# Patient Record
Sex: Male | Born: 1980 | Race: Black or African American | Hispanic: No | Marital: Single | Smoking: Current every day smoker
Health system: Southern US, Community
[De-identification: ages and names within clinical notes are randomized; demographics above are authoritative.]

## PROBLEM LIST (undated history)

## (undated) DIAGNOSIS — M5412 Radiculopathy, cervical region: Secondary | ICD-10-CM

---

## 2009-07-27 ENCOUNTER — Emergency Department (HOSPITAL_BASED_OUTPATIENT_CLINIC_OR_DEPARTMENT_OTHER): Admission: EM | Admit: 2009-07-27 | Discharge: 2009-07-27 | Payer: Self-pay | Admitting: Emergency Medicine

## 2009-07-27 ENCOUNTER — Ambulatory Visit: Payer: Self-pay | Admitting: Diagnostic Radiology

## 2009-12-29 ENCOUNTER — Emergency Department (HOSPITAL_BASED_OUTPATIENT_CLINIC_OR_DEPARTMENT_OTHER): Admission: EM | Admit: 2009-12-29 | Discharge: 2009-12-29 | Payer: Self-pay | Admitting: Emergency Medicine

## 2010-04-24 ENCOUNTER — Emergency Department (HOSPITAL_BASED_OUTPATIENT_CLINIC_OR_DEPARTMENT_OTHER): Admission: EM | Admit: 2010-04-24 | Discharge: 2010-04-24 | Payer: Self-pay | Admitting: Emergency Medicine

## 2010-09-08 ENCOUNTER — Emergency Department (HOSPITAL_BASED_OUTPATIENT_CLINIC_OR_DEPARTMENT_OTHER)
Admission: EM | Admit: 2010-09-08 | Discharge: 2010-09-08 | Payer: Self-pay | Source: Home / Self Care | Admitting: Emergency Medicine

## 2010-11-21 ENCOUNTER — Emergency Department (HOSPITAL_BASED_OUTPATIENT_CLINIC_OR_DEPARTMENT_OTHER)
Admission: EM | Admit: 2010-11-21 | Discharge: 2010-11-21 | Disposition: A | Discharge status: Home / Self Care | Payer: Self-pay | Attending: Emergency Medicine | Admitting: Emergency Medicine

## 2010-11-21 DIAGNOSIS — K029 Dental caries, unspecified: Secondary | ICD-10-CM | POA: Insufficient documentation

## 2010-11-21 DIAGNOSIS — F17200 Nicotine dependence, unspecified, uncomplicated: Secondary | ICD-10-CM | POA: Insufficient documentation

## 2012-03-25 ENCOUNTER — Encounter (HOSPITAL_BASED_OUTPATIENT_CLINIC_OR_DEPARTMENT_OTHER): Payer: Self-pay

## 2012-03-25 ENCOUNTER — Emergency Department (HOSPITAL_BASED_OUTPATIENT_CLINIC_OR_DEPARTMENT_OTHER)
Admission: EM | Admit: 2012-03-25 | Discharge: 2012-03-25 | Disposition: A | Discharge status: Home / Self Care | Payer: Self-pay | Attending: Emergency Medicine | Admitting: Emergency Medicine

## 2012-03-25 DIAGNOSIS — Z202 Contact with and (suspected) exposure to infections with a predominantly sexual mode of transmission: Secondary | ICD-10-CM | POA: Insufficient documentation

## 2012-03-25 MED ORDER — AZITHROMYCIN 250 MG PO TABS
1000.0000 mg | ORAL_TABLET | Freq: Once | ORAL | Status: AC
  Administered 2012-03-25: 1000 mg via ORAL
  Filled 2012-03-25: qty 4

## 2012-03-25 MED ORDER — CEFTRIAXONE SODIUM 250 MG IJ SOLR
250.0000 mg | Freq: Once | INTRAMUSCULAR | Status: AC
  Administered 2012-03-25: 250 mg via INTRAMUSCULAR
  Filled 2012-03-25: qty 250

## 2012-03-25 MED ORDER — METRONIDAZOLE 500 MG PO TABS: 500.0000 mg | ORAL_TABLET | Freq: Two times a day (BID) | ORAL | Status: AC

## 2012-03-25 NOTE — ED Notes (Signed)
No adverse reaction noted.

## 2012-03-25 NOTE — ED Provider Notes (Signed)
History     CSN: [id number]  Arrival date & time 03/25/12  1522   First MD Initiated Contact with Patient 03/25/12 1802      Chief Complaint  Patient presents with  . Exposure to STD    (Consider location/radiation/quality/duration/timing/severity/associated sxs/prior treatment) Patient is a 31 y.o. male presenting with STD exposure. The history is provided by the patient. No language interpreter was used.  Exposure to STD This is a new problem. The current episode started in the past 7 days. The problem has been unchanged.  Pt reports he had interrcourse on Sunday and partner called him today and said she had trich and chlamydia.  Pt here requesting treatment.  No symptoms  History reviewed. No pertinent past medical history.  History reviewed. No pertinent past surgical history.  No family history on file.  History  Substance Use Topics  . Smoking status: Current Everyday Smoker  . Smokeless tobacco: Not on file  . Alcohol Use: Yes      Review of Systems  All other systems reviewed and are negative.    Allergies  Review of patient's allergies indicates no known allergies.  Home Medications   Current Outpatient Rx  Name Route Sig Dispense Refill  . PENICILLIN V POTASSIUM 500 MG PO TABS Oral Take 500 mg by mouth 4 (four) times daily. Patient uses this medication for his wisdom tooth.      BP 123/66  Pulse 65  Temp 98.1 F (36.7 C) (Oral)  Resp 16  Ht 5\' 7"  (1.702 m)  Wt 130 lb (58.968 kg)  BMI 20.36 kg/m2  SpO2 100%  Physical Exam  Nursing note and vitals reviewed. Constitutional: He is oriented to person, place, and time. He appears well-developed and well-nourished.  HENT:  Head: Normocephalic.  Eyes: Pupils are equal, round, and reactive to light.  Abdominal: Soft.  Genitourinary: Penis normal.       No discharge  Musculoskeletal: Normal range of motion.  Neurological: He is alert and oriented to person, place, and time. He has normal reflexes.    Skin: Skin is warm.  Psychiatric: He has a normal mood and affect.    ED Course  Procedures (including critical care time)  Labs Reviewed - No data to display No results found.   1. Exposure to STD       MDM  gc and ct pending.  Pt given rocephin, zithromax and rx for flagyl         Elson Areas, PA 03/25/12 [address] [place], [place] 03/25/12 [phone]

## 2012-03-25 NOTE — ED Notes (Signed)
Advised today by sexual partner she is positive for STDs-pt denies c/o

## 2012-03-26 LAB — GC/CHLAMYDIA PROBE AMP, GENITAL: GC Probe Amp, Genital: NEGATIVE

## 2012-03-29 NOTE — ED Provider Notes (Signed)
Medical screening examination/treatment/procedure(s) were performed by non-physician practitioner and as supervising physician I was immediately available for consultation/collaboration.  Rush Salce M Alvenia Treese, MD 03/29/12 1237 

## 2012-09-21 ENCOUNTER — Emergency Department (HOSPITAL_BASED_OUTPATIENT_CLINIC_OR_DEPARTMENT_OTHER): Payer: Self-pay

## 2012-09-21 ENCOUNTER — Emergency Department (HOSPITAL_BASED_OUTPATIENT_CLINIC_OR_DEPARTMENT_OTHER)
Admission: EM | Admit: 2012-09-21 | Discharge: 2012-09-21 | Disposition: A | Discharge status: Home / Self Care | Payer: Self-pay | Attending: Emergency Medicine | Admitting: Emergency Medicine

## 2012-09-21 ENCOUNTER — Encounter (HOSPITAL_BASED_OUTPATIENT_CLINIC_OR_DEPARTMENT_OTHER): Payer: Self-pay | Admitting: *Deleted

## 2012-09-21 DIAGNOSIS — F17200 Nicotine dependence, unspecified, uncomplicated: Secondary | ICD-10-CM | POA: Insufficient documentation

## 2012-09-21 DIAGNOSIS — R197 Diarrhea, unspecified: Secondary | ICD-10-CM | POA: Insufficient documentation

## 2012-09-21 DIAGNOSIS — R509 Fever, unspecified: Secondary | ICD-10-CM | POA: Insufficient documentation

## 2012-09-21 DIAGNOSIS — R51 Headache: Secondary | ICD-10-CM | POA: Insufficient documentation

## 2012-09-21 DIAGNOSIS — R112 Nausea with vomiting, unspecified: Secondary | ICD-10-CM | POA: Insufficient documentation

## 2012-09-21 DIAGNOSIS — J189 Pneumonia, unspecified organism: Secondary | ICD-10-CM | POA: Insufficient documentation

## 2012-09-21 LAB — CBC
HCT: 45 % (ref 39.0–52.0)
Hemoglobin: 16.6 g/dL (ref 13.0–17.0)
MCH: 28.5 pg (ref 26.0–34.0)
MCHC: 36.9 g/dL — ABNORMAL HIGH (ref 30.0–36.0)
MCV: 77.2 fL — ABNORMAL LOW (ref 78.0–100.0)
RDW: 12.3 % (ref 11.5–15.5)

## 2012-09-21 LAB — BASIC METABOLIC PANEL
BUN: 12 mg/dL (ref 6–23)
CO2: 23 mEq/L (ref 19–32)
Chloride: 98 mEq/L (ref 96–112)
Creatinine, Ser: 0.9 mg/dL (ref 0.50–1.35)
GFR calc Af Amer: >90 mL/min (ref >90)
Glucose, Bld: 136 mg/dL — ABNORMAL HIGH (ref 70–99)
Potassium: 3.6 mEq/L (ref 3.5–5.1)

## 2012-09-21 MED ORDER — ONDANSETRON HCL 4 MG/2ML IJ SOLN
4.0000 mg | Freq: Once | INTRAMUSCULAR | Status: AC
  Administered 2012-09-21: 4 mg via INTRAVENOUS
  Filled 2012-09-21: qty 2

## 2012-09-21 MED ORDER — LEVOFLOXACIN 500 MG PO TABS
500.0000 mg | ORAL_TABLET | Freq: Once | ORAL | Status: AC
  Administered 2012-09-21: 500 mg via ORAL
  Filled 2012-09-21: qty 1

## 2012-09-21 MED ORDER — DIPHENOXYLATE-ATROPINE 2.5-0.025 MG PO TABS
1.0000 | ORAL_TABLET | Freq: Once | ORAL | Status: AC
  Administered 2012-09-21: 1 via ORAL
  Filled 2012-09-21: qty 1

## 2012-09-21 MED ORDER — LEVOFLOXACIN 500 MG PO TABS: 500.0000 mg | ORAL_TABLET | Freq: Every day | ORAL | Status: DC

## 2012-09-21 MED ORDER — SODIUM CHLORIDE 0.9 % IV BOLUS (SEPSIS)
1000.0000 mL | Freq: Once | INTRAVENOUS | Status: AC
  Administered 2012-09-21: 1000 mL via INTRAVENOUS

## 2012-09-21 NOTE — ED Provider Notes (Signed)
History     CSN: [id number]  Arrival date & time 09/21/12  1603   First MD Initiated Contact with Patient 09/21/12 1624      Chief Complaint  Patient presents with  . Cough    (Consider location/radiation/quality/duration/timing/severity/associated sxs/prior treatment) Patient is a 31 y.o. male presenting with cough. The history is provided by the patient. No language interpreter was used.  Cough This is a new problem. The current episode started more than 2 days ago. The problem has been gradually worsening. The cough is productive of sputum. The maximum temperature recorded prior to his arrival was 103 to 104 F. Associated symptoms include chills, sweats and headaches. Pertinent negatives include no chest pain and no shortness of breath. He is a smoker.   31 year old male coming in with complaint of productive cough for 4 days with fever vomiting and diarrhea. States he is also had body aches and headaches and decreased appetite. He has been on  no antibiotics in the last 3 months. He has had sick contacts. His stepson is also sick. Patient has taken over-the-counter Tylenol with some relief. Denies chest pain or shortness of breath. He took TheraFlu as well and felt better but got worse after 2 days.  History reviewed. No pertinent past medical history.  History reviewed. No pertinent past surgical history.  History reviewed. No pertinent family history.  History  Substance Use Topics  . Smoking status: Current Every Day Smoker  . Smokeless tobacco: Not on file  . Alcohol Use: Yes      Review of Systems  Constitutional: Positive for fever and chills.  Eyes: Negative.   Respiratory: Positive for cough. Negative for shortness of breath.   Cardiovascular: Negative.  Negative for chest pain.  Gastrointestinal: Positive for nausea, vomiting and diarrhea. Negative for abdominal pain.  Musculoskeletal: Negative for back pain.  Neurological: Positive for headaches.    Psychiatric/Behavioral: Negative.   All other systems reviewed and are negative.    Allergies  Review of patient's allergies indicates no known allergies.  Home Medications   Current Outpatient Rx  Name  Route  Sig  Dispense  Refill  . PENICILLIN V POTASSIUM 500 MG PO TABS   Oral   Take 500 mg by mouth 4 (four) times daily. Patient uses this medication for his wisdom tooth.           BP 105/90  Pulse 71  Temp 98.3 F (36.8 C)  Resp 18  Ht 5\' 9"  (1.753 m)  Wt 135 lb (61.236 kg)  BMI 19.94 kg/m2  SpO2 98%  Physical Exam  Nursing note and vitals reviewed. Constitutional: He is oriented to person, place, and time. He appears well-developed and well-nourished.  HENT:  Head: Normocephalic.  Eyes: Conjunctivae normal and EOM are normal. Pupils are equal, round, and reactive to light.  Neck: Normal range of motion. Neck supple.  Cardiovascular: Normal rate.   Pulmonary/Chest: Effort normal. No respiratory distress. He has no wheezes.       Decreased breath sounds on the right  Abdominal: Soft. Bowel sounds are normal. He exhibits no distension.  Musculoskeletal: Normal range of motion.  Neurological: He is alert and oriented to person, place, and time.  Skin: Skin is warm and dry.  Psychiatric: He has a normal mood and affect.    ED Course  Procedures (including critical care time)   Labs Reviewed  BASIC METABOLIC PANEL  CBC   No results found.   No diagnosis found.  MDM  Chest x-ray shows pneumonia in the right middle lobe. Cough nausea vomiting and diarrhea for 3 days with a fever. Prescription for Levaquin. Patient understands to come back for worsening symptoms. White blood cell count today was normal. He was given Lomotil Zofran and IV fluid in the ER.         Remi Haggard, NP 09/21/12 1807

## 2012-09-21 NOTE — ED Notes (Signed)
Pharmacists called to say Levaquin was too expensive for the pt.  Per MD [facility], it is ok to change rx to a Z-pack.  Pharmacy aware.

## 2012-09-21 NOTE — ED Notes (Signed)
Got sick on Christmas with flu-like s/s. Took Theraflu and felt better. Went outside yesterday and s/s returned, but worse. Now has N/V/D

## 2012-09-23 NOTE — ED Provider Notes (Signed)
Medical screening examination/treatment/procedure(s) were performed by non-physician practitioner and as supervising physician I was immediately available for consultation/collaboration.  Augusta Mirkin, MD 09/23/12 0952 

## 2013-06-15 ENCOUNTER — Encounter (HOSPITAL_BASED_OUTPATIENT_CLINIC_OR_DEPARTMENT_OTHER): Payer: Self-pay

## 2013-06-15 ENCOUNTER — Emergency Department (HOSPITAL_BASED_OUTPATIENT_CLINIC_OR_DEPARTMENT_OTHER)
Admission: EM | Admit: 2013-06-15 | Discharge: 2013-06-15 | Disposition: A | Discharge status: Home / Self Care | Payer: Self-pay | Attending: Emergency Medicine | Admitting: Emergency Medicine

## 2013-06-15 DIAGNOSIS — Z792 Long term (current) use of antibiotics: Secondary | ICD-10-CM | POA: Insufficient documentation

## 2013-06-15 DIAGNOSIS — R369 Urethral discharge, unspecified: Secondary | ICD-10-CM | POA: Insufficient documentation

## 2013-06-15 DIAGNOSIS — F17200 Nicotine dependence, unspecified, uncomplicated: Secondary | ICD-10-CM | POA: Insufficient documentation

## 2013-06-15 LAB — URINALYSIS, ROUTINE W REFLEX MICROSCOPIC
Bilirubin Urine: NEGATIVE
Glucose, UA: NEGATIVE mg/dL
Ketones, ur: 15 mg/dL — AB
Nitrite: NEGATIVE
Specific Gravity, Urine: 1.017 (ref 1.005–1.030)
pH: 6 (ref 5.0–8.0)

## 2013-06-15 LAB — URINE MICROSCOPIC-ADD ON

## 2013-06-15 MED ORDER — CEFTRIAXONE SODIUM 250 MG IJ SOLR
250.0000 mg | Freq: Once | INTRAMUSCULAR | Status: AC
  Administered 2013-06-15: 250 mg via INTRAMUSCULAR
  Filled 2013-06-15: qty 250

## 2013-06-15 MED ORDER — AZITHROMYCIN 250 MG PO TABS
1000.0000 mg | ORAL_TABLET | Freq: Once | ORAL | Status: AC
  Administered 2013-06-15: 1000 mg via ORAL
  Filled 2013-06-15: qty 4

## 2013-06-15 NOTE — ED Notes (Signed)
C/o penile d/c 2 days ago while in the shower-none since then-denies dysuria-admits to unprotected intercourse

## 2013-06-15 NOTE — ED Provider Notes (Signed)
CSN: [id number]     Arrival date & time 06/15/13  1328 History   First MD Initiated Contact with Patient 06/15/13 1341     Chief Complaint  Patient presents with  . Penile Discharge   (Consider location/radiation/quality/duration/timing/severity/associated sxs/prior Treatment) HPI Comments: Pt states that he noticed a white discharge 2 days ago and hasn't noticed any since:pt states that he has a similar history with std in the past:denies dysuria, fever, abdominal pain or rash  The history is provided by the patient. No language interpreter was used.    History reviewed. No pertinent past medical history. History reviewed. No pertinent past surgical history. No family history on file. History  Substance Use Topics  . Smoking status: Current Every Day Smoker  . Smokeless tobacco: Not on file  . Alcohol Use: Yes    Review of Systems  Constitutional: Negative.   Respiratory: Negative.   Cardiovascular: Negative.     Allergies  Review of patient's allergies indicates no known allergies.  Home Medications   Current Outpatient Rx  Name  Route  Sig  Dispense  Refill  . levofloxacin (LEVAQUIN) 500 MG tablet   Oral   Take 1 tablet (500 mg total) by mouth daily.   7 tablet   0   . penicillin v potassium (VEETID) 500 MG tablet   Oral   Take 500 mg by mouth 4 (four) times daily. Patient uses this medication for his wisdom tooth.          BP 129/66  Pulse 81  Temp(Src) 98.4 F (36.9 C) (Oral)  Resp 16  Ht 5\' 7"  (1.702 m)  Wt 130 lb (58.968 kg)  BMI 20.36 kg/m2  SpO2 100% Physical Exam  Nursing note and vitals reviewed. Constitutional: He is oriented to person, place, and time. He appears well-developed and well-nourished.  Cardiovascular: Normal rate and regular rhythm.   Pulmonary/Chest: Effort normal and breath sounds normal.  Abdominal: Soft. Bowel sounds are normal.  Genitourinary: No penile tenderness.  Musculoskeletal: Normal range of motion.  Neurological:  He is alert and oriented to person, place, and time.  Skin: Skin is warm and dry.    ED Course  Procedures (including critical care time) Labs Review Labs Reviewed  URINALYSIS, ROUTINE W REFLEX MICROSCOPIC - Abnormal; Notable for the following:    Hgb urine dipstick SMALL (*)    Ketones, ur 15 (*)    All other components within normal limits  GC/CHLAMYDIA PROBE AMP  URINE MICROSCOPIC-ADD ON   Imaging Review No results found.  MDM   1. Penile discharge    Pt treated based on history;culture sent:pt treated:also instructed on safe sex practices    Teressa Lower, NP 06/15/13 1428

## 2013-06-15 NOTE — ED Provider Notes (Signed)
Medical screening examination/treatment/procedure(s) were performed by non-physician practitioner and as supervising physician I was immediately available for consultation/collaboration.  Javoni Lucken, MD 06/15/13 1458 

## 2013-07-01 ENCOUNTER — Emergency Department (HOSPITAL_BASED_OUTPATIENT_CLINIC_OR_DEPARTMENT_OTHER)
Admission: EM | Admit: 2013-07-01 | Discharge: 2013-07-01 | Disposition: A | Discharge status: Home / Self Care | Payer: Self-pay | Attending: Emergency Medicine | Admitting: Emergency Medicine

## 2013-07-01 ENCOUNTER — Encounter (HOSPITAL_BASED_OUTPATIENT_CLINIC_OR_DEPARTMENT_OTHER): Payer: Self-pay | Admitting: Emergency Medicine

## 2013-07-01 DIAGNOSIS — H109 Unspecified conjunctivitis: Secondary | ICD-10-CM | POA: Insufficient documentation

## 2013-07-01 DIAGNOSIS — F17200 Nicotine dependence, unspecified, uncomplicated: Secondary | ICD-10-CM | POA: Insufficient documentation

## 2013-07-01 MED ORDER — SULFACETAMIDE SODIUM 10 % OP SOLN: 1.0000 [drp] | OPHTHALMIC | Status: DC

## 2013-07-01 NOTE — ED Notes (Signed)
3 days of eyes being red painful swollen and watering

## 2013-07-01 NOTE — ED Provider Notes (Signed)
CSN: [id number]     Arrival date & time 07/01/13  1128 History   First MD Initiated Contact with Patient 07/01/13 1416     Chief Complaint  Patient presents with  . Eye Pain   (Consider location/radiation/quality/duration/timing/severity/associated sxs/prior Treatment) HPI Comments: Patient is a 32 year old otherwise healthy male presents with complaints of bilateral eye redness and discomfort in the past 3 days. He denies any injury or trauma and denies any fluid or chemical exposure. He denies any recent ill contacts and he is not a contact lens wearer.  Patient is a 32 y.o. male presenting with eye pain. The history is provided by the patient.  Eye Pain This is a new problem. Episode onset: 3 days ago. The problem occurs constantly. The problem has been gradually worsening. Nothing aggravates the symptoms. Nothing relieves the symptoms. He has tried nothing for the symptoms. The treatment provided no relief.    History reviewed. No pertinent past medical history. History reviewed. No pertinent past surgical history. History reviewed. No pertinent family history. History  Substance Use Topics  . Smoking status: Current Every Day Smoker  . Smokeless tobacco: Not on file  . Alcohol Use: Yes    Review of Systems  Eyes: Positive for pain.  All other systems reviewed and are negative.    Allergies  Review of patient's allergies indicates no known allergies.  Home Medications   Current Outpatient Rx  Name  Route  Sig  Dispense  Refill  . levofloxacin (LEVAQUIN) 500 MG tablet   Oral   Take 1 tablet (500 mg total) by mouth daily.   7 tablet   0   . penicillin v potassium (VEETID) 500 MG tablet   Oral   Take 500 mg by mouth 4 (four) times daily. Patient uses this medication for his wisdom tooth.         . sulfacetamide (BLEPH-10) 10 % ophthalmic solution   Both Eyes   Place 1-2 drops into both eyes every 4 (four) hours.   5 mL   0    BP 105/64  Pulse 58  Temp(Src)  98.6 F (37 C) (Oral)  Resp 16  SpO2 100% Physical Exam  Nursing note and vitals reviewed. Constitutional: He is oriented to person, place, and time. He appears well-developed and well-nourished.  HENT:  Head: Normocephalic and atraumatic.  Mouth/Throat: Oropharynx is clear and moist.  Eyes:  Bilateral conjunctiva are injected. The corneas are clear and the pupils are reactive. There is no foreign body noted under the eyelids.  Neck: Normal range of motion. Neck supple.  Musculoskeletal: Normal range of motion.  Neurological: He is alert and oriented to person, place, and time.  Skin: Skin is warm. He is not diaphoretic.    ED Course  Procedures (including critical care time) Labs Review Labs Reviewed - No data to display Imaging Review No results found.  MDM   1. Conjunctivitis    This appears to be conjunctivitis, possibly bacterial. As he continues to worsen and is now having drainage from the eyes I will treat with Bleph-10. He is to return as needed if not improving or if his symptoms worsen.    Geoffery Lyons, MD 07/01/13 [id number]

## 2013-07-01 NOTE — ED Notes (Signed)
Pt c/o eyes watering and red x 3 days  Denies inj or fb,  Has used otc meds but no improvement

## 2013-12-21 ENCOUNTER — Encounter (HOSPITAL_BASED_OUTPATIENT_CLINIC_OR_DEPARTMENT_OTHER): Payer: Self-pay | Admitting: Emergency Medicine

## 2013-12-21 ENCOUNTER — Emergency Department (HOSPITAL_BASED_OUTPATIENT_CLINIC_OR_DEPARTMENT_OTHER)
Admission: EM | Admit: 2013-12-21 | Discharge: 2013-12-21 | Disposition: A | Discharge status: Home / Self Care | Payer: Self-pay | Attending: Emergency Medicine | Admitting: Emergency Medicine

## 2013-12-21 DIAGNOSIS — Z202 Contact with and (suspected) exposure to infections with a predominantly sexual mode of transmission: Secondary | ICD-10-CM | POA: Insufficient documentation

## 2013-12-21 DIAGNOSIS — Z792 Long term (current) use of antibiotics: Secondary | ICD-10-CM | POA: Insufficient documentation

## 2013-12-21 DIAGNOSIS — F17200 Nicotine dependence, unspecified, uncomplicated: Secondary | ICD-10-CM | POA: Insufficient documentation

## 2013-12-21 LAB — URINALYSIS, ROUTINE W REFLEX MICROSCOPIC
Bilirubin Urine: NEGATIVE
GLUCOSE, UA: NEGATIVE mg/dL
Hgb urine dipstick: NEGATIVE
KETONES UR: NEGATIVE mg/dL
LEUKOCYTES UA: NEGATIVE
Nitrite: NEGATIVE
PH: 6 (ref 5.0–8.0)
Protein, ur: NEGATIVE mg/dL
Specific Gravity, Urine: 1.021 (ref 1.005–1.030)
Urobilinogen, UA: 0.2 mg/dL (ref 0.0–1.0)

## 2013-12-21 MED ORDER — CEFTRIAXONE SODIUM 250 MG IJ SOLR
250.0000 mg | Freq: Once | INTRAMUSCULAR | Status: AC
  Administered 2013-12-21: 250 mg via INTRAMUSCULAR
  Filled 2013-12-21: qty 250

## 2013-12-21 MED ORDER — METRONIDAZOLE 500 MG PO TABS: 500.0000 mg | ORAL_TABLET | Freq: Two times a day (BID) | ORAL | Status: DC

## 2013-12-21 MED ORDER — AZITHROMYCIN 250 MG PO TABS
1000.0000 mg | ORAL_TABLET | Freq: Once | ORAL | Status: AC
  Administered 2013-12-21: 1000 mg via ORAL
  Filled 2013-12-21: qty 4

## 2013-12-21 NOTE — ED Notes (Signed)
States he has an STD and needs to be treated.

## 2013-12-21 NOTE — ED Provider Notes (Signed)
CSN: [id number]     Arrival date & time 12/21/13  1824 History   First MD Initiated Contact with Patient 12/21/13 2017     No chief complaint on file.    (Consider location/radiation/quality/duration/timing/severity/associated sxs/prior Treatment) HPI Comments: Patient is a 33 year old male who presents to the emergency department requesting treatment for sexually transmitted diseases. States his girlfriend was diagnosed with trichomonas 2 days ago. Denies penile discharge, pain, testicular pain or swelling, abdominal pain, nausea or vomiting.  The history is provided by the patient.    History reviewed. No pertinent past medical history. History reviewed. No pertinent past surgical history. No family history on file. History  Substance Use Topics  . Smoking status: Current Every Day Smoker  . Smokeless tobacco: Not on file  . Alcohol Use: Yes    Review of Systems  Constitutional: Negative for fever.  Gastrointestinal: Negative for nausea and abdominal pain.  Genitourinary: Negative for frequency, penile swelling, scrotal swelling, difficulty urinating, genital sores, penile pain and testicular pain.  Musculoskeletal: Negative for back pain.  Skin: Negative for rash.      Allergies  Review of patient's allergies indicates no known allergies.  Home Medications   Current Outpatient Rx  Name  Route  Sig  Dispense  Refill  . levofloxacin (LEVAQUIN) 500 MG tablet   Oral   Take 1 tablet (500 mg total) by mouth daily.   7 tablet   0   . metroNIDAZOLE (FLAGYL) 500 MG tablet   Oral   Take 1 tablet (500 mg total) by mouth 2 (two) times daily. One po bid x 7 days   14 tablet   0   . penicillin v potassium (VEETID) 500 MG tablet   Oral   Take 500 mg by mouth 4 (four) times daily. Patient uses this medication for his wisdom tooth.         . sulfacetamide (BLEPH-10) 10 % ophthalmic solution   Both Eyes   Place 1-2 drops into both eyes every 4 (four) hours.   5 mL   0    BP 121/60  Pulse 61  Temp(Src) 98.5 F (36.9 C) (Oral)  Resp 16  Ht 5\' 8"  (1.727 m)  Wt 130 lb (58.968 kg)  BMI 19.77 kg/m2  SpO2 98% Physical Exam  Nursing note and vitals reviewed. Constitutional: He is oriented to person, place, and time. He appears well-developed and well-nourished. No distress.  HENT:  Head: Normocephalic and atraumatic.  Mouth/Throat: Oropharynx is clear and moist.  Eyes: Conjunctivae are normal.  Neck: Normal range of motion. Neck supple.  Cardiovascular: Normal rate, regular rhythm and normal heart sounds.   Pulmonary/Chest: Effort normal and breath sounds normal.  Abdominal: Soft. Bowel sounds are normal. There is no tenderness.  Genitourinary: Right testis shows no mass, no swelling and no tenderness. Left testis shows no mass, no swelling and no tenderness. Uncircumcised. No penile erythema or penile tenderness.  Exam chaperoned.  Musculoskeletal: Normal range of motion. He exhibits no edema.  Lymphadenopathy:       Right: No inguinal adenopathy present.       Left: No inguinal adenopathy present.  Neurological: He is alert and oriented to person, place, and time.  Skin: Skin is warm and dry. He is not diaphoretic.  Psychiatric: He has a normal mood and affect. His behavior is normal.    ED Course  Procedures (including critical care time) Labs Review Labs Reviewed  GC/CHLAMYDIA PROBE AMP  URINALYSIS, ROUTINE W REFLEX MICROSCOPIC  Imaging Review No results found.   EKG Interpretation None      MDM   Final diagnoses:  Exposure to STD   Cultures obtained. Patient given 250 mg IM Rocephin and 1 g PO azithromycin. Prescription for Flagyl at discharge. Safe sex practices discussed. Stable for discharge.  Trevor Mace, PA-C 12/21/13 2029

## 2013-12-21 NOTE — Discharge Instructions (Signed)
You were treated today for gonorrhea, Chlamydia and Trichomonas. It is important for you to take Flagyl for the next 7 days, do not drink anything with alcohol products while taking Flagyl. If your gonorrhea and Chlamydia cultures results positive, you will be informed and are obligated to inform your partner.  Sexually Transmitted Disease A sexually transmitted disease (STD) is a disease or infection that may be passed (transmitted) from person to person, usually during sexual activity. This may happen by way of saliva, semen, blood, vaginal mucus, or urine. Common STDs include:   Gonorrhea.   Chlamydia.   Syphilis.   HIV and AIDS.   Genital herpes.   Hepatitis B and C.   Trichomonas.   Human papillomavirus (HPV).   Pubic lice.   Scabies.  Mites.  Bacterial vaginosis. WHAT ARE CAUSES OF STDs? An STD may be caused by bacteria, a virus, or parasites. STDs are often transmitted during sexual activity if one person is infected. However, they may also be transmitted through nonsexual means. STDs may be transmitted after:   Sexual intercourse with an infected person.   Sharing sex toys with an infected person.   Sharing needles with an infected person or using unclean piercing or tattoo needles.  Having intimate contact with the genitals, mouth, or rectal areas of an infected person.   Exposure to infected fluids during birth. WHAT ARE THE SIGNS AND SYMPTOMS OF STDs? Different STDs have different symptoms. Some people may not have any symptoms. If symptoms are present, they may include:   Painful or bloody urination.   Pain in the pelvis, abdomen, vagina, anus, throat, or eyes.   Skin rash, itching, irritation, growths, sores (lesions), ulcerations, or warts in the genital or anal area.  Abnormal vaginal discharge with or without bad odor.   Penile discharge in men.   Fever.   Pain or bleeding during sexual intercourse.   Swollen glands in the  groin area.   Yellow skin and eyes (jaundice). This is seen with hepatitis.   Swollen testicles.  Infertility.  Sores and blisters in the mouth. HOW ARE STDs DIAGNOSED? To make a diagnosis, your health care provider may:   Take a medical history.   Perform a physical exam.   Take a sample of any discharge for examination.  Swab the throat, cervix, opening to the penis, rectum, or vagina for testing.  Test a sample of your first morning urine.   Perform blood tests.   Perform a Pap smear, if this applies.   Perform a colposcopy.   Perform a laparoscopy.  HOW ARE STDs TREATED? Treatment depends on the STD. Some STDs may be treated but not cured.   Chlamydia, gonorrhea, trichomonas, and syphilis can be cured with antibiotics.   Genital herpes, hepatitis, and HIV can be treated, but not cured, with prescribed medicines. The medicines lessen symptoms.   Genital warts from HPV can be treated with medicine or by freezing, burning (electrocautery), or surgery. Warts may come back.   HPV cannot be cured with medicine or surgery. However, abnormal areas may be removed from the cervix, vagina, or vulva.   If your diagnosis is confirmed, your recent sexual partners need treatment. This is true even if they are symptom-free or have a negative culture or evaluation. They should not have sex until their health care providers say it is OK. HOW CAN I REDUCE MY RISK OF GETTING AN STD?  Use latex condoms, dental dams, and water-soluble lubricants during sexual activity. Do not  use petroleum jelly or oils.  Get vaccinated for HPV and hepatitis. If you have not received these vaccines in the past, talk to your health care provider about whether one or both might be right for you.   Avoid risky sex practices that can break the skin.  WHAT SHOULD I DO IF I THINK I HAVE AN STD?  See your health care provider.   Inform all sexual partners. They should be tested and treated  for any STDs.  Do not have sex until your health care provider says it is OK. WHEN SHOULD I GET HELP? Seek immediate medical care if:  You develop severe abdominal pain.  You are a man and notice swelling or pain in the testicles.  You are a woman and notice swelling or pain in your vagina. Document Released: 12/01/2002 Document Revised: 07/01/2013 Document Reviewed: 03/31/2013 [facility] Patient Information 2014 [place], [place].

## 2013-12-22 LAB — GC/CHLAMYDIA PROBE AMP
CT Probe RNA: POSITIVE — AB
GC Probe RNA: NEGATIVE

## 2013-12-22 NOTE — ED Provider Notes (Signed)
Medical screening examination/treatment/procedure(s) were performed by non-physician practitioner and as supervising physician I was immediately available for consultation/collaboration.   EKG Interpretation None        Derek Huneycutt, MD 12/22/13 0011 

## 2013-12-23 NOTE — ED Notes (Signed)
+   Chlamydia Patient treated with Rocephin and Zithromax-[facility]

## 2013-12-25 ENCOUNTER — Telehealth (HOSPITAL_BASED_OUTPATIENT_CLINIC_OR_DEPARTMENT_OTHER): Payer: Self-pay

## 2013-12-25 NOTE — Telephone Encounter (Signed)
Pt calling for STD results.  ID verified x 2.  Pt informed Gonorrhea was negative and Chlamydia was positive.  Pt txd appropriately w/Zithromax and Rocephin.  Pt asked to inform partner for testing and tx and abstain from sex for 2 wks post tx.

## 2014-08-13 ENCOUNTER — Emergency Department (HOSPITAL_BASED_OUTPATIENT_CLINIC_OR_DEPARTMENT_OTHER)
Admission: EM | Admit: 2014-08-13 | Discharge: 2014-08-13 | Disposition: A | Discharge status: Home / Self Care | Payer: Self-pay | Attending: Emergency Medicine | Admitting: Emergency Medicine

## 2014-08-13 ENCOUNTER — Emergency Department (HOSPITAL_BASED_OUTPATIENT_CLINIC_OR_DEPARTMENT_OTHER): Payer: Self-pay

## 2014-08-13 DIAGNOSIS — Z792 Long term (current) use of antibiotics: Secondary | ICD-10-CM | POA: Insufficient documentation

## 2014-08-13 DIAGNOSIS — R05 Cough: Secondary | ICD-10-CM

## 2014-08-13 DIAGNOSIS — R059 Cough, unspecified: Secondary | ICD-10-CM

## 2014-08-13 DIAGNOSIS — J40 Bronchitis, not specified as acute or chronic: Secondary | ICD-10-CM

## 2014-08-13 DIAGNOSIS — J209 Acute bronchitis, unspecified: Secondary | ICD-10-CM | POA: Insufficient documentation

## 2014-08-13 DIAGNOSIS — Z79899 Other long term (current) drug therapy: Secondary | ICD-10-CM | POA: Insufficient documentation

## 2014-08-13 DIAGNOSIS — Z720 Tobacco use: Secondary | ICD-10-CM | POA: Insufficient documentation

## 2014-08-13 MED ORDER — BENZONATATE 100 MG PO CAPS: 100.0000 mg | ORAL_CAPSULE | Freq: Three times a day (TID) | ORAL | Status: DC

## 2014-08-13 MED ORDER — ALBUTEROL SULFATE HFA 108 (90 BASE) MCG/ACT IN AERS
2.0000 | INHALATION_SPRAY | RESPIRATORY_TRACT | Status: DC | PRN
  Administered 2014-08-13: 2 via RESPIRATORY_TRACT
  Filled 2014-08-13 (×2): qty 6.7

## 2014-08-13 MED ORDER — AZITHROMYCIN 250 MG PO TABS: 250.0000 mg | ORAL_TABLET | Freq: Every day | ORAL | Status: DC

## 2014-08-13 NOTE — ED Provider Notes (Signed)
CSN: [id number]     Arrival date & time 08/13/14  1415 History   First MD Initiated Contact with Patient 08/13/14 1541     Chief Complaint  Patient presents with  . Cough     (Consider location/radiation/quality/duration/timing/severity/associated sxs/prior Treatment) HPI Comments: Patient presents with worsening cough over last 2 weeks. He states it started as some Raynaud's and nasal congestion over the last week and half that has progressed to chest congestion. He is coughing up dark green sputum. He denies any shortness of breath. Denies any chest pain. There is no fevers nausea or vomiting. He is a current everyday smoker. He denies any leg pain or swelling. He's been taking over-the-counter medicines without relief.  Patient is a 33 y.o. male presenting with cough.  Cough Associated symptoms: rhinorrhea and wheezing   Associated symptoms: no chest pain, no chills, no diaphoresis, no fever, no headaches, no rash and no shortness of breath     No past medical history on file. No past surgical history on file. No family history on file. History  Substance Use Topics  . Smoking status: Current Every Day Smoker  . Smokeless tobacco: Not on file  . Alcohol Use: Yes    Review of Systems  Constitutional: Negative for fever, chills, diaphoresis and fatigue.  HENT: Positive for congestion and rhinorrhea. Negative for sneezing.   Eyes: Negative.   Respiratory: Positive for cough and wheezing. Negative for chest tightness and shortness of breath.   Cardiovascular: Negative for chest pain and leg swelling.  Gastrointestinal: Negative for nausea, vomiting, abdominal pain, diarrhea and blood in stool.  Genitourinary: Negative for frequency, hematuria, flank pain and difficulty urinating.  Musculoskeletal: Negative for back pain and arthralgias.  Skin: Negative for rash.  Neurological: Negative for dizziness, speech difficulty, weakness, numbness and headaches.      Allergies   Review of patient's allergies indicates no known allergies.  Home Medications   Prior to Admission medications   Medication Sig Start Date End Date Taking? Authorizing Provider  azithromycin (ZITHROMAX) 250 MG tablet Take 1 tablet (250 mg total) by mouth daily. Take first 2 tablets together, then 1 every day until finished. 08/13/14   Rolan BuccoMelanie Sindy Mccune, MD  benzonatate (TESSALON) 100 MG capsule Take 1 capsule (100 mg total) by mouth every 8 (eight) hours. 08/13/14   Rolan BuccoMelanie Leshawn Straka, MD  metroNIDAZOLE (FLAGYL) 500 MG tablet Take 1 tablet (500 mg total) by mouth 2 (two) times daily. One po bid x 7 days 12/21/13   Kathrynn Speedobyn M Hess, PA-C  penicillin v potassium (VEETID) 500 MG tablet Take 500 mg by mouth 4 (four) times daily. Patient uses this medication for his wisdom tooth.    Historical Provider, MD  sulfacetamide (BLEPH-10) 10 % ophthalmic solution Place 1-2 drops into both eyes every 4 (four) hours. 07/01/13   Geoffery Lyonsouglas Delo, MD   BP 112/49 mmHg  Pulse 67  Temp(Src) 99.5 F (37.5 C)  Resp 17  Ht 5\' 9"  (1.753 m)  Wt 135 lb (61.236 kg)  BMI 19.93 kg/m2  SpO2 98% Physical Exam  Constitutional: He is oriented to person, place, and time. He appears well-developed and well-nourished.  HENT:  Head: Normocephalic and atraumatic.  Eyes: Pupils are equal, round, and reactive to light.  Neck: Normal range of motion. Neck supple.  Cardiovascular: Normal rate, regular rhythm and normal heart sounds.   Pulmonary/Chest: Effort normal. No respiratory distress. He has wheezes (mild expiratory wheezes at the bases bilaterally, no increased work of breathing). He has  no rales. He exhibits no tenderness.  Abdominal: Soft. Bowel sounds are normal. There is no tenderness. There is no rebound and no guarding.  Musculoskeletal: Normal range of motion. He exhibits no edema.  Lymphadenopathy:    He has no cervical adenopathy.  Neurological: He is alert and oriented to person, place, and time.  Skin: Skin is warm and  dry. No rash noted.  Psychiatric: He has a normal mood and affect.    ED Course  Procedures (including critical care time) Labs Review Labs Reviewed - No data to display  Imaging Review Dg Chest 2 View  08/13/2014   CLINICAL DATA:  Initial encounter for two week history of cough.  EXAM: CHEST  2 VIEW  COMPARISON:  09/21/2012  FINDINGS: The heart size and mediastinal contours are within normal limits. Both lungs are clear. The visualized skeletal structures are unremarkable.  IMPRESSION: Normal exam.   Electronically Signed   By: Kennith CenterEric  Mansell M.D.   On: 08/13/2014 15:05     EKG Interpretation None      MDM   Final diagnoses:  Bronchitis    No evidence of pneumonia.  Since has been going on for 2 weeks and +smoking, will start on abx, albuterol inhaler, tessalon perles.  Advised to return if his symptoms worsen.    Rolan BuccoMelanie Sheri Prows, MD 08/13/14 [phone]

## 2014-08-13 NOTE — ED Notes (Signed)
Cough with congestion coughing up dk green phlem per pt.

## 2014-08-13 NOTE — ED Notes (Signed)
MD at bedside. 

## 2014-08-13 NOTE — Discharge Instructions (Signed)
Cough, Adult  A cough is a reflex that helps clear your throat and airways. It can help heal the body or may be a reaction to an irritated airway. A cough may only last 2 or 3 weeks (acute) or may last more than 8 weeks (chronic).  CAUSES Acute cough:  Viral or bacterial infections. Chronic cough:  Infections.  Allergies.  Asthma.  Post-nasal drip.  Smoking.  Heartburn or acid reflux.  Some medicines.  Chronic lung problems (COPD).  Cancer. SYMPTOMS   Cough.  Fever.  Chest pain.  Increased breathing rate.  High-pitched whistling sound when breathing (wheezing).  Colored mucus that you cough up (sputum). TREATMENT   A bacterial cough may be treated with antibiotic medicine.  A viral cough must run its course and will not respond to antibiotics.  Your caregiver may recommend other treatments if you have a chronic cough. HOME CARE INSTRUCTIONS   Only take over-the-counter or prescription medicines for pain, discomfort, or fever as directed by your caregiver. Use cough suppressants only as directed by your caregiver.  Use a cold steam vaporizer or humidifier in your bedroom or home to help loosen secretions.  Sleep in a semi-upright position if your cough is worse at night.  Rest as needed.  Stop smoking if you smoke. SEEK IMMEDIATE MEDICAL CARE IF:   You have pus in your sputum.  Your cough starts to worsen.  You cannot control your cough with suppressants and are losing sleep.  You begin coughing up blood.  You have difficulty breathing.  You develop pain which is getting worse or is uncontrolled with medicine.  You have a fever. MAKE SURE YOU:   Understand these instructions.  Will watch your condition.  Will get help right away if you are not doing well or get worse. Document Released: 03/09/2011 Document Revised: 12/03/2011 Document Reviewed: 03/09/2011 ExitCare Patient Information 2015 [employer]. This information is not intended  to replace advice given to you by your health care provider. Make sure you discuss any questions you have with your health care provider.  

## 2015-03-22 ENCOUNTER — Encounter (HOSPITAL_BASED_OUTPATIENT_CLINIC_OR_DEPARTMENT_OTHER): Payer: Self-pay

## 2015-03-22 ENCOUNTER — Emergency Department (HOSPITAL_BASED_OUTPATIENT_CLINIC_OR_DEPARTMENT_OTHER)
Admission: EM | Admit: 2015-03-22 | Discharge: 2015-03-22 | Disposition: A | Discharge status: Home / Self Care | Payer: Self-pay | Attending: Emergency Medicine | Admitting: Emergency Medicine

## 2015-03-22 DIAGNOSIS — Z202 Contact with and (suspected) exposure to infections with a predominantly sexual mode of transmission: Secondary | ICD-10-CM | POA: Insufficient documentation

## 2015-03-22 DIAGNOSIS — Z720 Tobacco use: Secondary | ICD-10-CM | POA: Insufficient documentation

## 2015-03-22 MED ORDER — METRONIDAZOLE 500 MG PO TABS
2000.0000 mg | ORAL_TABLET | Freq: Once | ORAL | Status: AC
  Administered 2015-03-22: 2000 mg via ORAL
  Filled 2015-03-22: qty 4

## 2015-03-22 NOTE — Discharge Instructions (Signed)
Trichomoniasis °Trichomoniasis is an infection caused by an organism called Trichomonas. The infection can affect both women and men. In women, the outer male genitalia and the vagina are affected. In men, the penis is mainly affected, but the prostate and other reproductive organs can also be involved. Trichomoniasis is a sexually transmitted infection (STI) and is most often passed to another person through sexual contact.  °RISK FACTORS °· Having unprotected sexual intercourse. °· Having sexual intercourse with an infected partner. °SIGNS AND SYMPTOMS  °Symptoms of trichomoniasis in women include: °· Abnormal gray-green frothy vaginal discharge. °· Itching and irritation of the vagina. °· Itching and irritation of the area outside the vagina. °Symptoms of trichomoniasis in men include:  °· Penile discharge with or without pain. °· Pain during urination. This results from inflammation of the urethra. °DIAGNOSIS  °Trichomoniasis may be found during a Pap test or physical exam. Your health care provider may use one of the following methods to help diagnose this infection: °· Examining vaginal discharge under a microscope. For men, urethral discharge would be examined. °· Testing the pH of the vagina with a test tape. °· Using a vaginal swab test that checks for the Trichomonas organism. A test is available that provides results within a few minutes. °· Doing a culture test for the organism. This is not usually needed. °TREATMENT  °· You may be given medicine to fight the infection. Women should inform their health care provider if they could be or are pregnant. Some medicines used to treat the infection should not be taken during pregnancy. °· Your health care provider may recommend over-the-counter medicines or creams to decrease itching or irritation. °· Your sexual partner will need to be treated if infected. °HOME CARE INSTRUCTIONS  °· Take medicines only as directed by your health care provider. °· Take  over-the-counter medicine for itching or irritation as directed by your health care provider. °· Do not have sexual intercourse while you have the infection. °· Women should not douche or wear tampons while they have the infection. °· Discuss your infection with your partner. Your partner may have gotten the infection from you, or you may have gotten it from your partner. °· Have your sex partner get examined and treated if necessary. °· Practice safe, informed, and protected sex. °· See your health care provider for other STI testing. °SEEK MEDICAL CARE IF:  °· You still have symptoms after you finish your medicine. °· You develop abdominal pain. °· You have pain when you urinate. °· You have bleeding after sexual intercourse. °· You develop a rash. °· Your medicine makes you sick or makes you throw up (vomit). °MAKE SURE YOU: °· Understand these instructions. °· Will watch your condition. °· Will get help right away if you are not doing well or get worse. °Document Released: 03/06/2001 Document Revised: 01/25/2014 Document Reviewed: 06/22/2013 °Exit[employer]® Patient Information ©2015 [employer]. This information is not intended to replace advice given to you by your health care provider. Make sure you discuss any questions you have with your health care provider. ° °Sexually Transmitted Disease °A sexually transmitted disease (STD) is a disease or infection that may be passed (transmitted) from person to person, usually during sexual activity. This may happen by way of saliva, semen, blood, vaginal mucus, or urine. Common STDs include:  °· Gonorrhea.   °· Chlamydia.   °· Syphilis.   °· HIV and AIDS.   °· Genital herpes.   °· Hepatitis B and C.   °· Trichomonas.   °· Human papillomavirus (  HPV).   °· Pubic lice.   °· Scabies. °· Mites. °· Bacterial vaginosis. °WHAT ARE CAUSES OF STDs? °An STD may be caused by bacteria, a virus, or parasites. STDs are often transmitted during sexual activity if one person is infected.  However, they may also be transmitted through nonsexual means. STDs may be transmitted after:  °· Sexual intercourse with an infected person.   °· Sharing sex toys with an infected person.   °· Sharing needles with an infected person or using unclean piercing or tattoo needles. °· Having intimate contact with the genitals, mouth, or rectal areas of an infected person.   °· Exposure to infected fluids during birth. °WHAT ARE THE SIGNS AND SYMPTOMS OF STDs? °Different STDs have different symptoms. Some people may not have any symptoms. If symptoms are present, they may include:  °· Painful or bloody urination.   °· Pain in the pelvis, abdomen, vagina, anus, throat, or eyes.   °· A skin rash, itching, or irritation. °· Growths, ulcerations, blisters, or sores in the genital and anal areas. °· Abnormal vaginal discharge with or without bad odor.   °· Penile discharge in men.   °· Fever.   °· Pain or bleeding during sexual intercourse.   °· Swollen glands in the groin area.   °· Yellow skin and eyes (jaundice). This is seen with hepatitis.   °· Swollen testicles. °· Infertility. °· Sores and blisters in the mouth. °HOW ARE STDs DIAGNOSED? °To make a diagnosis, your health care provider may:  °· Take a medical history.   °· Perform a physical exam.   °· Take a sample of any discharge to examine. °· Swab the throat, cervix, opening to the penis, rectum, or vagina for testing. °· Test a sample of your first morning urine.   °· Perform blood tests.   °· Perform a Pap test, if this applies.   °· Perform a colposcopy.   °· Perform a laparoscopy.   °HOW ARE STDs TREATED? ° Treatment depends on the STD. Some STDs may be treated but not cured.  °· Chlamydia, gonorrhea, trichomonas, and syphilis can be cured with antibiotic medicine.   °· Genital herpes, hepatitis, and HIV can be treated, but not cured, with prescribed medicines. The medicines lessen symptoms.   °· Genital warts from HPV can be treated with medicine or by  freezing, burning (electrocautery), or surgery. Warts may come back.   °· HPV cannot be cured with medicine or surgery. However, abnormal areas may be removed from the cervix, vagina, or vulva.   °· If your diagnosis is confirmed, your recent sexual partners need treatment. This is true even if they are symptom-free or have a negative culture or evaluation. They should not have sex until their health care providers say it is okay. °HOW CAN I REDUCE MY RISK OF GETTING AN STD? °Take these steps to reduce your risk of getting an STD: °· Use latex condoms, dental dams, and water-soluble lubricants during sexual activity. Do not use petroleum jelly or oils. °· Avoid having multiple sex partners. °· Do not have sex with someone who has other sex partners. °· Do not have sex with anyone you do not know or who is at high risk for an STD. °· Avoid risky sex practices that can break your skin. °· Do not have sex if you have open sores on your mouth or skin. °· Avoid drinking too much alcohol or taking illegal drugs. Alcohol and drugs can affect your judgment and put you in a vulnerable position. °· Avoid engaging in oral and anal sex acts. °· Get vaccinated for HPV and hepatitis. If you   have not received these vaccines in the past, talk to your health care provider about whether one or both might be right for you.   °· If you are at risk of being infected with HIV, it is recommended that you take a prescription medicine daily to prevent HIV infection. This is called pre-exposure prophylaxis (PrEP). You are considered at risk if: °¨ You are a man who has sex with other men (MSM). °¨ You are a heterosexual man or woman and are sexually active with more than one partner. °¨ You take drugs by injection. °¨ You are sexually active with a partner who has HIV. °· Talk with your health care provider about whether you are at high risk of being infected with HIV. If you choose to begin PrEP, you should first be tested for HIV. You  should then be tested every 3 months for as long as you are taking PrEP.   °WHAT SHOULD I DO IF I THINK I HAVE AN STD? °· See your health care provider.   °· Tell your sexual partner(s). They should be tested and treated for any STDs. °· Do not have sex until your health care provider says it is okay.  °WHEN SHOULD I GET IMMEDIATE MEDICAL CARE? °Contact your health care provider right away if:  °· You have severe abdominal pain. °· You are a man and notice swelling or pain in your testicles. °· You are a woman and notice swelling or pain in your vagina. °Document Released: 12/01/2002 Document Revised: 09/15/2013 Document Reviewed: 03/31/2013 °ExitCare® Patient Information ©2015 [employer]. This information is not intended to replace advice given to you by your health care provider. Make sure you discuss any questions you have with your health care provider. ° °

## 2015-03-22 NOTE — ED Provider Notes (Signed)
CSN: [id number]     Arrival date & time 03/22/15  1847 History  This chart was scribed for [name], MD by Lyndel SafeKaitlyn Shelton, ED Scribe. This patient was seen in room MH05/MH05 and the patient's care was started 7:05 PM.   Chief Complaint  Patient presents with  . v74.5    Patient is a 34 y.o. male presenting with STD exposure. The history is provided by the patient. No language interpreter was used.  Exposure to STD This is a recurrent problem. The problem occurs constantly. The problem has not changed since onset.Nothing aggravates the symptoms. Nothing relieves the symptoms. He has tried nothing for the symptoms. The treatment provided no relief.    HPI Comments: Christopher Roman is a 34 y.o. male, with a PMhx of STDs, who presents to the Emergency Department complaining of a reoccuring exposure to multiple stds. Pt was seen and treated for chlamydia, gonorrhea, and trichomonas 3 months ago with a dose of 250 mg IM Rocephin and 1g PO azithromycin and also discharged home with a flagyl prescription. He is still having sexual intercourse without protection with his sof who was recently re-tested positive for trichomonas. His sof's doctor recommended he receive treatment again for trichomonas due to his drinking excessively during the course of the antibiotic treatment that he received 3 months ago. He denies experiencing similar symptoms to his past std symptoms, penile discharge, penile pain, or dysuria.   History reviewed. No pertinent past medical history. History reviewed. No pertinent past surgical history. No family history on file. History  Substance Use Topics  . Smoking status: Current Every Day Smoker  . Smokeless tobacco: Not on file  . Alcohol Use: Yes    Review of Systems  Genitourinary: Negative for dysuria, discharge, penile swelling, scrotal swelling, genital sores, penile pain and testicular pain.  All other systems reviewed and are negative.   Allergies  Review of  patient's allergies indicates no known allergies.  Home Medications   Prior to Admission medications   Medication Sig Start Date End Date Taking? Authorizing Provider  sulfacetamide (BLEPH-10) 10 % ophthalmic solution Place 1-2 drops into both eyes every 4 (four) hours. 07/01/13   Geoffery Lyonsouglas Delo, MD   BP 146/66 mmHg  Pulse 62  Temp(Src) 98.2 F (36.8 C) (Oral)  Resp 18  SpO2 98% Physical Exam  Constitutional: He is oriented to person, place, and time. He appears well-developed and well-nourished. No distress.  HENT:  Head: Normocephalic and atraumatic.  Eyes: Conjunctivae are normal. No scleral icterus.  Neck: Neck supple.  Cardiovascular: Normal rate and intact distal pulses.   Pulmonary/Chest: Effort normal. No stridor. No respiratory distress.  Abdominal: Normal appearance. He exhibits no distension.  Genitourinary: Penis normal. Right testis shows no mass and no tenderness. Left testis shows no mass and no tenderness. No penile erythema. No discharge found.  Neurological: He is alert and oriented to person, place, and time.  Skin: Skin is warm and dry. No rash noted.  Psychiatric: He has a normal mood and affect. His behavior is normal.  Nursing note and vitals reviewed.   ED Course  Procedures  DIAGNOSTIC STUDIES: Oxygen Saturation is 98% on RA, normal by my interpretation.    COORDINATION OF CARE: 7:12 PM Discussed treatment plan with pt. Will give treatment for trichomonas after getting urinalysis. Discussed safe sex practices and offered testing for HIV. Pt acknowledges and agrees to plan.   Labs Review Labs Reviewed  GC[facility]CH[facility]AM[facility]IA PROBE AMP () [facility]  Imaging Review No results found.   EKG Interpretation None      MDM   Final diagnoses:  Exposure to STD    Sexual partner tested positive for trichomonas.  She did not test positive for GC/C.  Will check urine GC/C, but treat presumptively for trich.  He declined HIV/RPR.    I personally  performed the services described in this documentation, which was scribed in my presence. The recorded information has been reviewed and is accurate.     Blake Divine, MD 03/22/15 [phone]

## 2015-03-22 NOTE — ED Notes (Signed)
Patient here requesting treatment for STD, girlfriend tested positive for STD

## 2015-03-22 NOTE — ED Notes (Signed)
Girl friend tested pos for std  He wants to be treated

## 2015-03-23 LAB — GC/CHLAMYDIA PROBE AMP (~~LOC~~) NOT AT ARMC
Chlamydia: NEGATIVE
Neisseria Gonorrhea: NEGATIVE

## 2015-05-19 ENCOUNTER — Encounter (HOSPITAL_BASED_OUTPATIENT_CLINIC_OR_DEPARTMENT_OTHER): Payer: Self-pay | Admitting: *Deleted

## 2015-05-19 ENCOUNTER — Emergency Department (HOSPITAL_BASED_OUTPATIENT_CLINIC_OR_DEPARTMENT_OTHER)
Admission: EM | Admit: 2015-05-19 | Discharge: 2015-05-19 | Disposition: A | Discharge status: Home / Self Care | Payer: Self-pay | Attending: Emergency Medicine | Admitting: Emergency Medicine

## 2015-05-19 DIAGNOSIS — Z720 Tobacco use: Secondary | ICD-10-CM | POA: Insufficient documentation

## 2015-05-19 DIAGNOSIS — Y998 Other external cause status: Secondary | ICD-10-CM | POA: Insufficient documentation

## 2015-05-19 DIAGNOSIS — S1086XA Insect bite of other specified part of neck, initial encounter: Secondary | ICD-10-CM | POA: Insufficient documentation

## 2015-05-19 DIAGNOSIS — W57XXXA Bitten or stung by nonvenomous insect and other nonvenomous arthropods, initial encounter: Secondary | ICD-10-CM | POA: Insufficient documentation

## 2015-05-19 DIAGNOSIS — Y9389 Activity, other specified: Secondary | ICD-10-CM | POA: Insufficient documentation

## 2015-05-19 DIAGNOSIS — Y9289 Other specified places as the place of occurrence of the external cause: Secondary | ICD-10-CM | POA: Insufficient documentation

## 2015-05-19 MED ORDER — CLINDAMYCIN HCL 150 MG PO CAPS: 450.0000 mg | ORAL_CAPSULE | Freq: Three times a day (TID) | ORAL | Status: DC

## 2015-05-19 NOTE — ED Notes (Signed)
Pt alert, NAD,calm, interactive, resps e/u, speaking in clear complete sentences, no dyspnea noted (denies: pain, sob, nausea or dizziness).

## 2015-05-19 NOTE — ED Provider Notes (Signed)
CSN: [id number]     Arrival date & time 05/19/15  1800 History  This chart was scribed for Tilden Fossa, MD by Gwenyth Ober, ED Scribe. This patient was seen in room MH03/MH03 and the patient's care was started at 6:37 PM.   Chief Complaint  Patient presents with  . Insect Bite   The history is provided by the patient. No language interpreter was used.    HPI Comments: Christopher Roman is a 34 y.o. male with no chronic medical history who presents to the Emergency Department complaining of four areas of redness and swelling on his left lateral neck, with associated mild pain, that appeared this morning when he woke up. He has tried Neosporin with no relief. Pt reports the area is proximal to a "hickey" on his neck that was present the day prior to the onset of swelling. He did not see or feel an insect bite yesterday. Pt denies a history of skin infections. He also denies fever, itchiness, trouble swallowing and decreased ROM of his neck as associated symptoms.  History reviewed. No pertinent past medical history. History reviewed. No pertinent past surgical history. No family history on file. Social History  Substance Use Topics  . Smoking status: Current Every Day Smoker -- 1.00 packs/day    Types: Cigarettes  . Smokeless tobacco: None  . Alcohol Use: 2.4 oz/week    4 Cans of beer per week     Comment: daily    Review of Systems  Constitutional: Negative for fever.  HENT: Negative for trouble swallowing.   Musculoskeletal: Positive for neck pain.  Skin: Positive for wound.  All other systems reviewed and are negative.  Allergies  Review of patient's allergies indicates no known allergies.  Home Medications   Prior to Admission medications   Medication Sig Start Date End Date Taking? Authorizing Provider  sulfacetamide (BLEPH-10) 10 % ophthalmic solution Place 1-2 drops into both eyes every 4 (four) hours. 07/01/13   Geoffery Lyons, MD   BP 118/55 mmHg  Pulse 91  Temp(Src)  98.1 F (36.7 C) (Oral)  Resp 20  Ht 5\' 9"  (1.753 m)  Wt 135 lb (61.236 kg)  BMI 19.93 kg/m2  SpO2 100% Physical Exam  Constitutional: He is oriented to person, place, and time. He appears well-developed and well-nourished.  HENT:  Head: Normocephalic and atraumatic.  Neck: Neck supple.  Cardiovascular: Normal rate and regular rhythm.   No murmur heard. Pulmonary/Chest: Effort normal and breath sounds normal. No respiratory distress.  Abdominal: There is no tenderness. There is no rebound and no guarding.  Musculoskeletal: He exhibits no edema or tenderness.  Neurological: He is alert and oriented to person, place, and time.  Skin: Skin is warm and dry.  4 linear regions of induration with central punctations on the left lateral neck. Small area of ecchymosis in the vicinity of these lesions. No cellulitis or abscess.  Nursing note and vitals reviewed.   ED Course  Procedures   DIAGNOSTIC STUDIES: Oxygen Saturation is 100% on RA, normal by my interpretation.    COORDINATION OF CARE: 6:44 PM Discussed treatment plan with pt which includes antibiotics. Pt agreed to plan.   Labs Review Labs Reviewed - No data to display  Imaging Review No results found.   EKG Interpretation None      MDM   Final diagnoses:  Insect bite    Patient with areas of swelling to his left lateral neck, recently had a hickey to this area and the day  before the swelling started. Examination is not consistent with cellulitis or abscess, but given recent oral contact the area will treat for possible early infection. Discussed with patient home care as well as return precautions.  I personally performed the services described in this documentation, which was scribed in my presence. The recorded information has been reviewed and is accurate.    Tilden Fossa, MD 05/19/15 [phone]

## 2015-05-19 NOTE — ED Notes (Signed)
Possible insect bite to his left neck. Woke this am with 3 linear raised marks.

## 2015-05-19 NOTE — Discharge Instructions (Signed)

## 2015-12-23 ENCOUNTER — Emergency Department (HOSPITAL_BASED_OUTPATIENT_CLINIC_OR_DEPARTMENT_OTHER): Payer: Self-pay

## 2015-12-23 ENCOUNTER — Encounter (HOSPITAL_BASED_OUTPATIENT_CLINIC_OR_DEPARTMENT_OTHER): Payer: Self-pay | Admitting: Emergency Medicine

## 2015-12-23 ENCOUNTER — Emergency Department (HOSPITAL_BASED_OUTPATIENT_CLINIC_OR_DEPARTMENT_OTHER)
Admission: EM | Admit: 2015-12-23 | Discharge: 2015-12-23 | Disposition: A | Discharge status: Home / Self Care | Payer: Self-pay | Attending: Emergency Medicine | Admitting: Emergency Medicine

## 2015-12-23 DIAGNOSIS — S29002A Unspecified injury of muscle and tendon of back wall of thorax, initial encounter: Secondary | ICD-10-CM | POA: Insufficient documentation

## 2015-12-23 DIAGNOSIS — R0781 Pleurodynia: Secondary | ICD-10-CM

## 2015-12-23 DIAGNOSIS — Y998 Other external cause status: Secondary | ICD-10-CM | POA: Insufficient documentation

## 2015-12-23 DIAGNOSIS — Y9289 Other specified places as the place of occurrence of the external cause: Secondary | ICD-10-CM | POA: Insufficient documentation

## 2015-12-23 DIAGNOSIS — Y9389 Activity, other specified: Secondary | ICD-10-CM | POA: Insufficient documentation

## 2015-12-23 DIAGNOSIS — W11XXXA Fall on and from ladder, initial encounter: Secondary | ICD-10-CM | POA: Insufficient documentation

## 2015-12-23 DIAGNOSIS — F17210 Nicotine dependence, cigarettes, uncomplicated: Secondary | ICD-10-CM | POA: Insufficient documentation

## 2015-12-23 MED ORDER — IBUPROFEN 800 MG PO TABS: 800.0000 mg | ORAL_TABLET | Freq: Three times a day (TID) | ORAL | Status: DC

## 2015-12-23 NOTE — ED Provider Notes (Signed)
CSN: [id number]     Arrival date & time 12/23/15  1938 History   First MD Initiated Contact with Patient 12/23/15 2100     Chief Complaint  Patient presents with  . Rib Pain    HPI  Christopher Roman is a 35 year old male presenting after a fall. He fell from a platform approximately 3 feet and landed on his back. He states he had bilateral posterior rib pain for the past 3 days. He initially thought this would resolve without intervention but it has been persistent prompted his emergency department visit today. He describes his pain as aching. This is worsened with movement, deep inspiration and palpation. He denies shortness of breath or difficulty breathing. He has not tried any home remedies for the pain as he thought it would resolve on its own. He denies bruising or wounds to his back where he fell. He denies other injuries sustained in the fall. He did not strike his head. He has no other complaints today.  History reviewed. No pertinent past medical history. History reviewed. No pertinent past surgical history. History reviewed. No pertinent family history. Social History  Substance Use Topics  . Smoking status: Current Every Day Smoker -- 1.00 packs/day    Types: Cigarettes  . Smokeless tobacco: None  . Alcohol Use: 2.4 oz/week    4 Cans of beer per week     Comment: daily    Review of Systems  Musculoskeletal: Positive for myalgias.  All other systems reviewed and are negative.     Allergies  Review of patient's allergies indicates no known allergies.  Home Medications   Prior to Admission medications   Medication Sig Start Date End Date Taking? Authorizing Provider  ibuprofen (ADVIL,MOTRIN) 800 MG tablet Take 1 tablet (800 mg total) by mouth 3 (three) times daily. 12/23/15   Sumedh Shinsato, PA-C   BP 128/65 mmHg  Pulse 73  Temp(Src) 98.8 F (37.1 C) (Oral)  Resp 18  Ht  (1.753 m)  Wt 61.236 kg  BMI 19.93 kg/m2  SpO2 100% Physical Exam  Constitutional: He  appears well-developed and well-nourished. No distress.  HENT:  Head: Normocephalic and atraumatic.  Eyes: Conjunctivae are normal. Right eye exhibits no discharge. Left eye exhibits no discharge. No scleral icterus.  Neck: Normal range of motion.  Cardiovascular: Normal rate, regular rhythm and normal heart sounds.   Pulmonary/Chest: Effort normal and breath sounds normal. No respiratory distress.  Good air movement in all lung fields  Musculoskeletal: Normal range of motion.       Back:  Mild tenderness to palpation over the posterior chest wall. No focal tenderness over the thoracic spine. No ecchymosis, erythema or other skin changes noted to the back. No bony deformities of the rib cage or spine. Full range of motion of the cervical, thoracic and lumbar spine. Moves all extremities spontaneously and without pain. Ambulates with a steady gait.  Neurological: He is alert. Coordination normal.  Skin: Skin is warm and dry.  Psychiatric: He has a normal mood and affect. His behavior is normal.  Nursing note and vitals reviewed.   ED Course  Procedures (including critical care time) Labs Review Labs Reviewed - No data to display  Imaging Review Dg Ribs Bilateral W/chest  12/23/2015  CLINICAL DATA:  Larey Seat from ladder.  Bilateral rib pain for 3 days. EXAM: BILATERAL RIBS AND CHEST - 4+ VIEW COMPARISON:  Chest radiograph 08/13/2014 FINDINGS: Frontal view the chest and multiple views of bilateral ribs. Midline trachea. Normal  heart size and mediastinal contours. No pleural effusion or pneumothorax. Clear lungs. Radiopaque marker about ninth posterior lateral right rib, without underlying displaced rib fracture. Radiographic marker also identified about the posterior lateral left ninth rib. No underlying displaced rib fracture. IMPRESSION: No displaced rib fracture, pneumothorax, or acute disease. Electronically Signed   By: Jeronimo GreavesKyle  Talbot M.D.   On: 12/23/2015 22:40   I have personally reviewed  and evaluated these images and lab results as part of my medical decision-making.   EKG Interpretation None      MDM   Final diagnoses:  Rib pain   35 year old male presenting with posterior bilateral rib pain after a fall 3 days ago. He has not tried any treatments prior to arrival. No tachypnea or tachycardia. Patient is nontoxic-appearing. Mild tenderness to palpation of the posterior bilateral ribs. No pain over the thoracic spine. No increased work of breathing. Lungs clear to auscultation bilaterally with good air movement in all lung fields. No overlying skin changes of the back. Patient moves remaining extremities spontaneously and without pain. X-ray of ribs and chest without acute injury. Encouraged conservative pain management including NSAIDs, Tylenol, heating pad, ice and rest. Given referral information for PCP if symptoms persist. Return precautions given in discharge paperwork and discussed with pt at bedside. Pt stable for discharge    Christopher HeimlichStevi Jaheim Canino, PA-C 12/23/15 2305  Doug SouSam Jacubowitz, MD 12/23/15 2329

## 2015-12-23 NOTE — ED Notes (Signed)
Pt states he fell on ladder from about 4 feet, c/o bilateral rib pain for 3 days

## 2015-12-23 NOTE — ED Notes (Signed)
PA at bedside.

## 2015-12-23 NOTE — Discharge Instructions (Signed)

## 2016-09-07 ENCOUNTER — Encounter (HOSPITAL_BASED_OUTPATIENT_CLINIC_OR_DEPARTMENT_OTHER): Payer: Self-pay | Admitting: *Deleted

## 2016-09-07 ENCOUNTER — Emergency Department (HOSPITAL_BASED_OUTPATIENT_CLINIC_OR_DEPARTMENT_OTHER)
Admission: EM | Admit: 2016-09-07 | Discharge: 2016-09-07 | Disposition: A | Discharge status: Home / Self Care | Payer: Self-pay | Attending: Emergency Medicine | Admitting: Emergency Medicine

## 2016-09-07 DIAGNOSIS — Z202 Contact with and (suspected) exposure to infections with a predominantly sexual mode of transmission: Secondary | ICD-10-CM | POA: Insufficient documentation

## 2016-09-07 DIAGNOSIS — F17210 Nicotine dependence, cigarettes, uncomplicated: Secondary | ICD-10-CM | POA: Insufficient documentation

## 2016-09-07 DIAGNOSIS — Z791 Long term (current) use of non-steroidal anti-inflammatories (NSAID): Secondary | ICD-10-CM | POA: Insufficient documentation

## 2016-09-07 MED ORDER — CEFTRIAXONE SODIUM 250 MG IJ SOLR
250.0000 mg | Freq: Once | INTRAMUSCULAR | Status: AC
  Administered 2016-09-07: 250 mg via INTRAMUSCULAR
  Filled 2016-09-07: qty 250

## 2016-09-07 MED ORDER — AZITHROMYCIN 250 MG PO TABS
1000.0000 mg | ORAL_TABLET | Freq: Once | ORAL | Status: AC
  Administered 2016-09-07: 1000 mg via ORAL
  Filled 2016-09-07: qty 4

## 2016-09-07 NOTE — ED Triage Notes (Signed)
STD exposure

## 2016-09-07 NOTE — Discharge Instructions (Signed)
You have been treated for gonorrhea and chlamydia. We will contact you if your culture results are positive. Recommend to abstain from sex for the next 7-10 days for medications to take full effect. Follow-up with the health dept for any ongoing issues.

## 2016-09-07 NOTE — ED Provider Notes (Signed)
[facility] Provider Note   CSN: [id number] Arrival date & time: 09/07/16  1638  By signing my name below, I, [name], attest that this documentation has been prepared under the direction and in the presence of Sharilyn SitesLisa Lannah Koike, PA-C. Electronically Signed: Angelene GiovanniEmmanuella Mensah, ED Scribe. 09/07/16. 5:01 PM.   History   Chief Complaint Chief Complaint  Patient presents with  . SEXUALLY TRANSMITTED DISEASE    HPI Comments: Christopher Roman is a 35 y.o. male who presents to the Emergency Department requesting STD testing s/p exposure to Gonorrhea. He explains that he had sexual intercourse with a male partner 2 days ago and she was diagnosed with Gonorrhea yesterday. He denies any penile or urinary symptoms. Pt has not tried any medications PTA. He has NKDA. He denies any fever, chills, vomiting, or any other symptoms. No other complaints at this time.  Hx of gonorrhea in the past.  The history is provided by the patient. No language interpreter was used.    History reviewed. No pertinent past medical history.  There are no active problems to display for this patient.   History reviewed. No pertinent surgical history.     Home Medications    Prior to Admission medications   Medication Sig Start Date End Date Taking? Authorizing Provider  ibuprofen (ADVIL,MOTRIN) 800 MG tablet Take 1 tablet (800 mg total) by mouth 3 (three) times daily. 12/23/15   Rolm GalaStevi Barrett, PA-C    Family History No family history on file.  Social History Social History  Substance Use Topics  . Smoking status: Current Every Day Smoker    Packs/day: 1.00    Types: Cigarettes  . Smokeless tobacco: Never Used  . Alcohol use 2.4 oz/week    4 Cans of beer per week     Comment: daily     Allergies   Patient has no known allergies.   Review of Systems Review of Systems  Constitutional: Negative for chills and fever.  Genitourinary: Negative for discharge, dysuria, frequency,  penile pain and testicular pain.  Skin: Negative for rash.  All other systems reviewed and are negative.    Physical Exam Updated Vital Signs BP 127/87   Pulse 73   Temp 98.2 F (36.8 C) (Oral)   Resp 18   Ht 5\' 9"  (1.753 m)   Wt 135 lb (61.2 kg)   SpO2 99%   BMI 19.94 kg/m   Physical Exam  Constitutional: He is oriented to person, place, and time. He appears well-developed and well-nourished.  HENT:  Head: Normocephalic and atraumatic.  Mouth/Throat: Oropharynx is clear and moist.  Eyes: Conjunctivae and EOM are normal. Pupils are equal, round, and reactive to light.  Neck: Normal range of motion.  Cardiovascular: Normal rate, regular rhythm and normal heart sounds.   Pulmonary/Chest: Effort normal and breath sounds normal.  Abdominal: Soft. Bowel sounds are normal.  Musculoskeletal: Normal range of motion.  Neurological: He is alert and oriented to person, place, and time.  Skin: Skin is warm and dry.  Psychiatric: He has a normal mood and affect.  Nursing note and vitals reviewed.   ED Treatments / Results  DIAGNOSTIC STUDIES: Oxygen Saturation is 99% on RA, normal by my interpretation.    COORDINATION OF CARE: 4:58 PM- Pt advised of plan for treatment and pt agrees. Pt will be treated with Rocephin and Zithromax. He will also receive STD testing here.    Labs (all labs ordered are listed, but only abnormal results are displayed) Labs Reviewed  GC[facility]CHLAMYDIA PROBE AMP ([place]    EKG  EKG Interpretation None       Radiology No results found.  Procedures Procedures (including critical care time)  Medications Ordered in ED Medications  cefTRIAXone (ROCEPHIN) injection 250 mg (250 mg Intramuscular Given 09/07/16 1722)  azithromycin (ZITHROMAX) tablet 1,000 mg (1,000 mg Oral Given 09/07/16 1722)     Initial Impression / Assessment and Plan / ED Course  Sharilyn SitesLisa Aalaysia Liggins, PA-C has reviewed the triage vital signs and the nursing  notes.  Pertinent labs & imaging results that were available during my care of the patient were reviewed by me and considered in my medical decision making (see chart for details).  Clinical Course    35 year old male here for STD check. Reports exposure to gonorrhea from sexual encounter days ago. He denies any current symptoms. He was treated prophylactically here with Rocephin and azithromycin.  Gc/chl pending.  Patient advised he will be informed of his results in the next 48-72 hours.  Recommended to abstain from sex for the next 7-10 days for medications to take effect.  Recommended to follow-up with the health dept for future STD checks.  Discussed plan with patient, he acknowledged understanding and agreed with plan of care.  Return precautions given for new or worsening symptoms.  Final Clinical Impressions(s) / ED Diagnoses   Final diagnoses:  STD exposure    New Prescriptions New Prescriptions   No medications on file   I personally performed the services described in this documentation, which was scribed in my presence. The recorded information has been reviewed and is accurate.   Garlon HatchetLisa M Helina Hullum, PA-C 09/07/16 1753    Raeford RazorStephen Kohut, MD 09/11/16 1116

## 2016-09-10 LAB — GC/CHLAMYDIA PROBE AMP (~~LOC~~) NOT AT ARMC
Chlamydia: NEGATIVE
NEISSERIA GONORRHEA: NEGATIVE

## 2017-06-17 ENCOUNTER — Emergency Department (HOSPITAL_BASED_OUTPATIENT_CLINIC_OR_DEPARTMENT_OTHER)
Admission: EM | Admit: 2017-06-17 | Discharge: 2017-06-17 | Disposition: A | Discharge status: Home / Self Care | Payer: Self-pay | Attending: Emergency Medicine | Admitting: Emergency Medicine

## 2017-06-17 ENCOUNTER — Emergency Department (HOSPITAL_BASED_OUTPATIENT_CLINIC_OR_DEPARTMENT_OTHER): Payer: Self-pay

## 2017-06-17 ENCOUNTER — Encounter (HOSPITAL_BASED_OUTPATIENT_CLINIC_OR_DEPARTMENT_OTHER): Payer: Self-pay | Admitting: *Deleted

## 2017-06-17 DIAGNOSIS — F17210 Nicotine dependence, cigarettes, uncomplicated: Secondary | ICD-10-CM | POA: Insufficient documentation

## 2017-06-17 DIAGNOSIS — S92325A Nondisplaced fracture of second metatarsal bone, left foot, initial encounter for closed fracture: Secondary | ICD-10-CM | POA: Insufficient documentation

## 2017-06-17 DIAGNOSIS — Y999 Unspecified external cause status: Secondary | ICD-10-CM | POA: Insufficient documentation

## 2017-06-17 DIAGNOSIS — Y929 Unspecified place or not applicable: Secondary | ICD-10-CM | POA: Insufficient documentation

## 2017-06-17 DIAGNOSIS — X58XXXA Exposure to other specified factors, initial encounter: Secondary | ICD-10-CM | POA: Insufficient documentation

## 2017-06-17 DIAGNOSIS — Z202 Contact with and (suspected) exposure to infections with a predominantly sexual mode of transmission: Secondary | ICD-10-CM | POA: Insufficient documentation

## 2017-06-17 DIAGNOSIS — Y939 Activity, unspecified: Secondary | ICD-10-CM | POA: Insufficient documentation

## 2017-06-17 LAB — URINALYSIS, ROUTINE W REFLEX MICROSCOPIC
Glucose, UA: NEGATIVE mg/dL
Ketones, ur: 40 mg/dL — AB
Leukocytes, UA: NEGATIVE
NITRITE: NEGATIVE
PH: 6.5 (ref 5.0–8.0)
Protein, ur: 30 mg/dL — AB
SPECIFIC GRAVITY, URINE: 1.025 (ref 1.005–1.030)

## 2017-06-17 LAB — URINALYSIS, MICROSCOPIC (REFLEX)

## 2017-06-17 MED ORDER — CEFTRIAXONE SODIUM 250 MG IJ SOLR: 250.0000 mg | Freq: Once | INTRAMUSCULAR | Status: DC

## 2017-06-17 MED ORDER — IBUPROFEN 400 MG PO TABS
600.0000 mg | ORAL_TABLET | Freq: Once | ORAL | Status: AC
  Administered 2017-06-17: 19:00:00 600 mg via ORAL
  Filled 2017-06-17: qty 1

## 2017-06-17 MED ORDER — IBUPROFEN 800 MG PO TABS: 800.0000 mg | ORAL_TABLET | Freq: Three times a day (TID) | ORAL | 0 refills | Status: DC | PRN

## 2017-06-17 MED ORDER — METRONIDAZOLE 500 MG PO TABS
2000.0000 mg | ORAL_TABLET | Freq: Once | ORAL | Status: AC
  Administered 2017-06-17: 2000 mg via ORAL
  Filled 2017-06-17: qty 4

## 2017-06-17 MED ORDER — AZITHROMYCIN 250 MG PO TABS: 1000.0000 mg | ORAL_TABLET | Freq: Once | ORAL | Status: DC

## 2017-06-17 NOTE — ED Notes (Signed)
Pt verbalizes understanding of d/c instructions and denies any further needs at this time. 

## 2017-06-17 NOTE — ED Triage Notes (Signed)
Injury to his left foot 2 days ago. Bruising and swelling. He also wants to be checked for STD. Was told he has had an exposure.

## 2017-06-17 NOTE — ED Notes (Signed)
Pt states he was exposed to trich and only needs to be treated for that

## 2017-06-17 NOTE — Discharge Instructions (Signed)
Return here as needed.  Follow-up with the orthopedist provided.  Ice and elevate your foot °

## 2017-06-17 NOTE — ED Notes (Signed)
ED Provider at bedside. 

## 2017-06-21 NOTE — ED Provider Notes (Signed)
[facility]-EMERGENCY DEPT Provider Note   CSN: [id number] Arrival date & time: 06/17/17  1710     History   Chief Complaint Chief Complaint  Patient presents with  . Foot Injury  . Exposure to STD    HPI Christopher Roman is a 36 y.o. male.  HPI Patient presents to the emergency department with needing treatment for Trichomonas and also an injury to his left foot that occurred 2 days ago.  The patient states she was out drinking and injured his foot.  He is unsure of what he did.  He states there is bruising and swelling to the top of her foot.  Patient states that nothing seems make his condition, better with movement and palpation makes the pain worse.  Patient denies any nausea, vomiting, weakness, dizziness, headache, blurred vision, back pain, neck pain or syncope History reviewed. No pertinent past medical history.  There are no active problems to display for this patient.   History reviewed. No pertinent surgical history.     Home Medications    Prior to Admission medications   Medication Sig Start Date End Date Taking? Authorizing Provider  ibuprofen (ADVIL,MOTRIN) 800 MG tablet Take 1 tablet (800 mg total) by mouth every 8 (eight) hours as needed. 06/17/17   Charlestine Night, PA-C    Family History No family history on file.  Social History Social History  Substance Use Topics  . Smoking status: Current Every Day Smoker    Packs/day: 1.00    Types: Cigarettes  . Smokeless tobacco: Never Used  . Alcohol use 2.4 oz/week    4 Cans of beer per week     Comment: daily     Allergies   Patient has no known allergies.   Review of Systems Review of Systems All other systems negative except as documented in the HPI. All pertinent positives and negatives as reviewed in the HPI.  Physical Exam Updated Vital Signs BP 127/77   Pulse 78   Temp 98.4 F (36.9 C) (Oral)   Resp 18   Ht  (1.803 m)   Wt 61.2 kg (135 lb)   SpO2 98%   BMI 18.83 kg/m    Physical Exam  Constitutional: He is oriented to person, place, and time. He appears well-developed and well-nourished. No distress.  HENT:  Head: Normocephalic and atraumatic.  Eyes: Pupils are equal, round, and reactive to light.  Pulmonary/Chest: Effort normal.  Musculoskeletal:       Feet:  Neurological: He is alert and oriented to person, place, and time.  Skin: Skin is warm and dry.  Psychiatric: He has a normal mood and affect.  Nursing note and vitals reviewed.    ED Treatments / Results  Labs (all labs ordered are listed, but only abnormal results are displayed) Labs Reviewed  URINALYSIS, ROUTINE W REFLEX MICROSCOPIC - Abnormal; Notable for the following:       Result Value   Hgb urine dipstick TRACE (*)    Bilirubin Urine MODERATE (*)    Ketones, ur 40 (*)    Protein, ur 30 (*)    All other components within normal limits  URINALYSIS, MICROSCOPIC (REFLEX) - Abnormal; Notable for the following:    Bacteria, UA RARE (*)    Squamous Epithelial / LPF 0-5 (*)    All other components within normal limits    EKG  EKG Interpretation None       Radiology No results found.  Procedures Procedures (including critical care time)  Medications Ordered  in ED Medications  ibuprofen (ADVIL,MOTRIN) tablet 600 mg (600 mg Oral Given 06/17/17 1919)  metroNIDAZOLE (FLAGYL) tablet 2,000 mg (2,000 mg Oral Given 06/17/17 1945)     Initial Impression / Assessment and Plan / ED Course  I have reviewed the triage vital signs and the nursing notes.  Pertinent labs & imaging results that were available during my care of the patient were reviewed by me and considered in my medical decision making (see chart for details).     Patient be treated for metatarsal fracture.  Given orthopedics follow-up.  Told to return here as needed.  He was also treated for Trichomonas as well.  He refuses treatment for any other STDs  Final Clinical Impressions(s) / ED Diagnoses   Final  diagnoses:  Closed nondisplaced fracture of second metatarsal bone of left foot, initial encounter  Exposure to STD    New Prescriptions Discharge Medication List as of 06/17/2017  8:06 PM       Charlestine Night, PA-C 06/21/17 0143    Vanetta Mulders, MD 06/29/17 [id number]

## 2017-11-28 IMAGING — CR DG RIBS W/ CHEST 3+V BILAT
7 series · 7 of 7 positions shown · non-contrast
Comparison: Chest radiograph 08/13/2014

CLINICAL DATA: Fell from ladder.  Bilateral rib pain for 3 days.

EXAM:
BILATERAL RIBS AND CHEST - 4+ VIEW

[w chest pa]
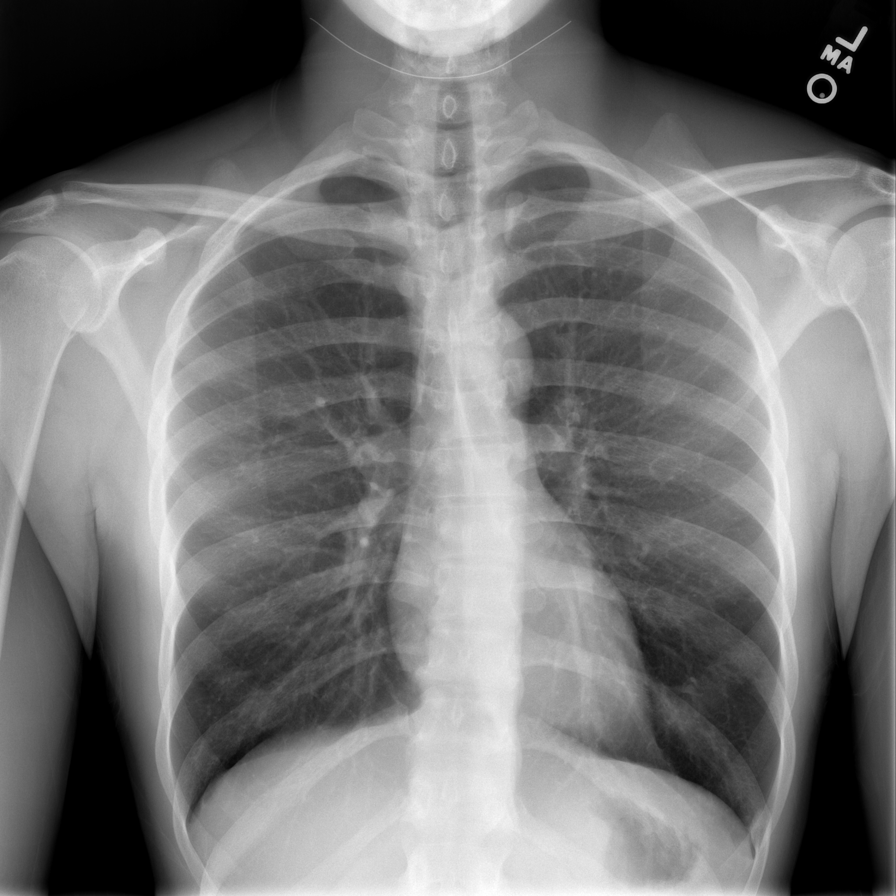

[w ribs ap/pa upper left]
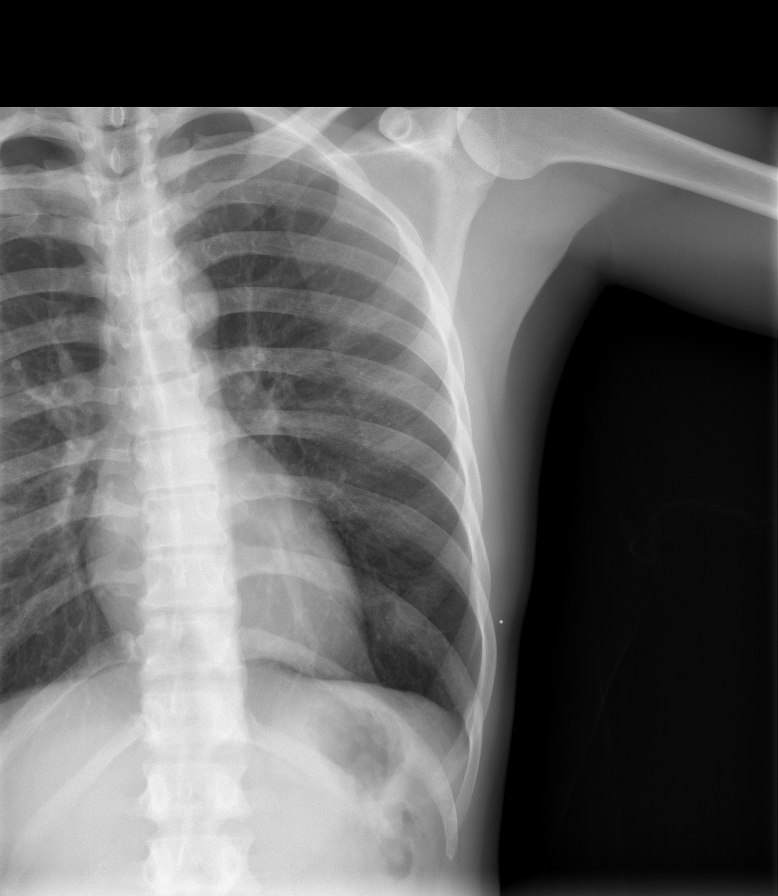

[w ribs ap/pa upper right (1 of 2)]
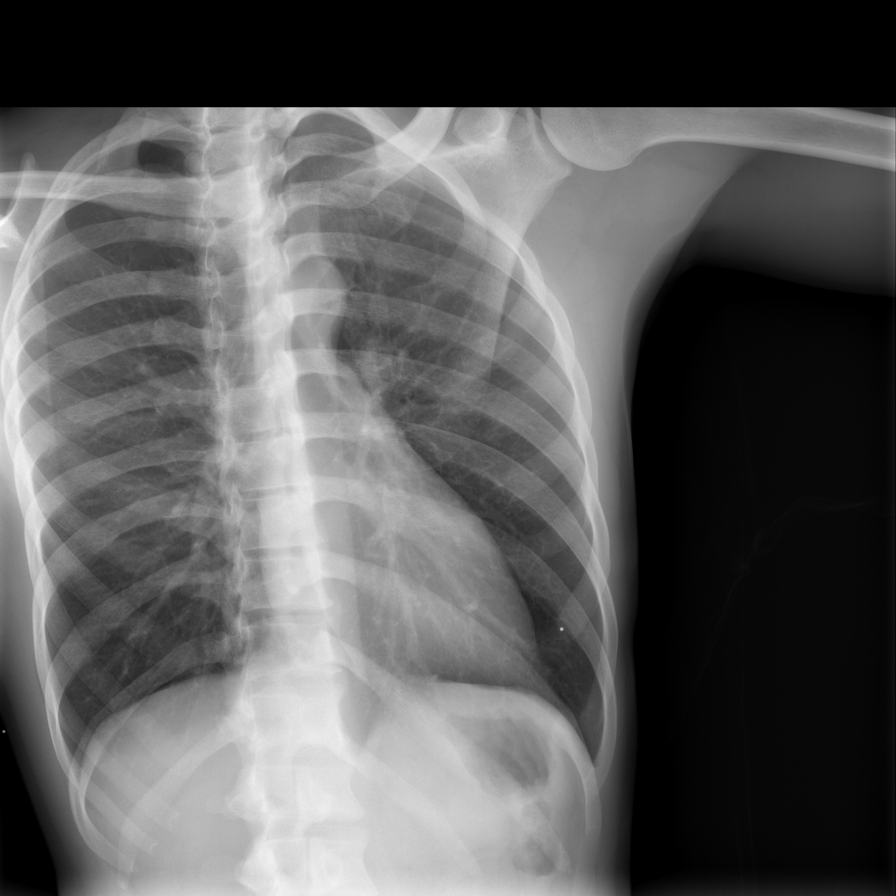

[w ribs ap/pa upper right (2 of 2)]
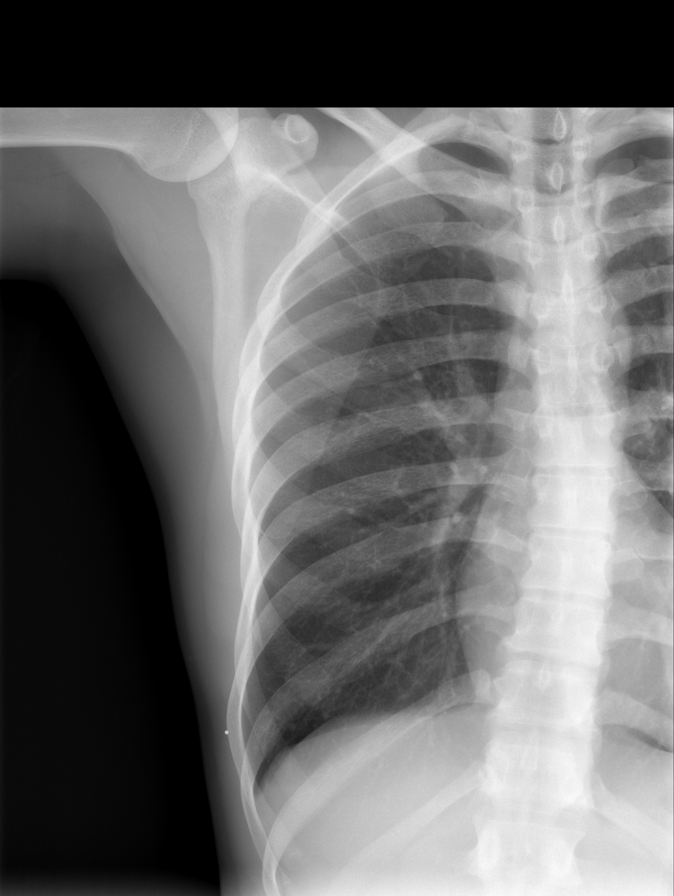

[w ribs oblique right]
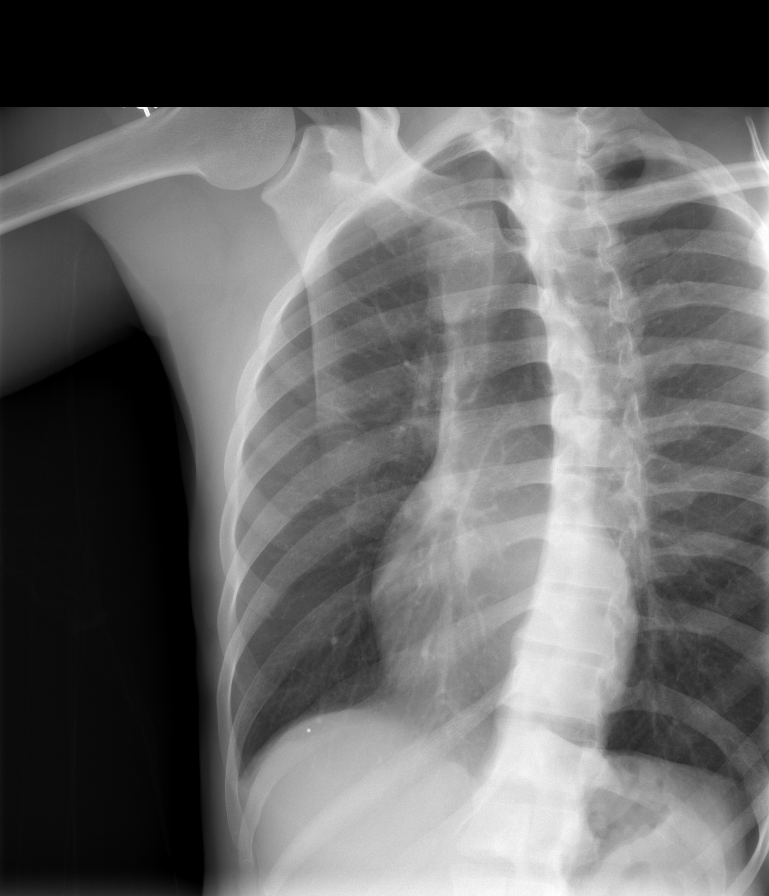

[w ribs ap/pa lower left]
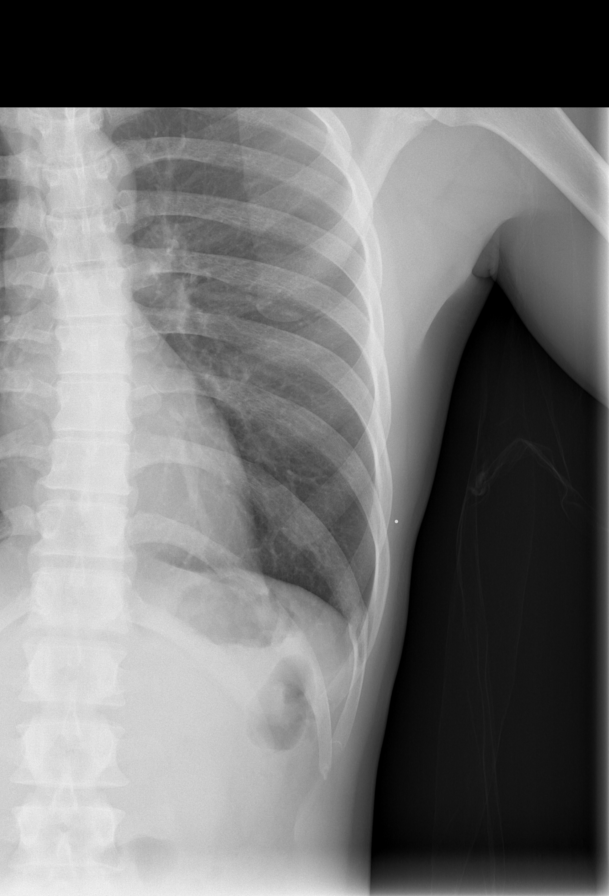

[w ribs ap/pa lower right]
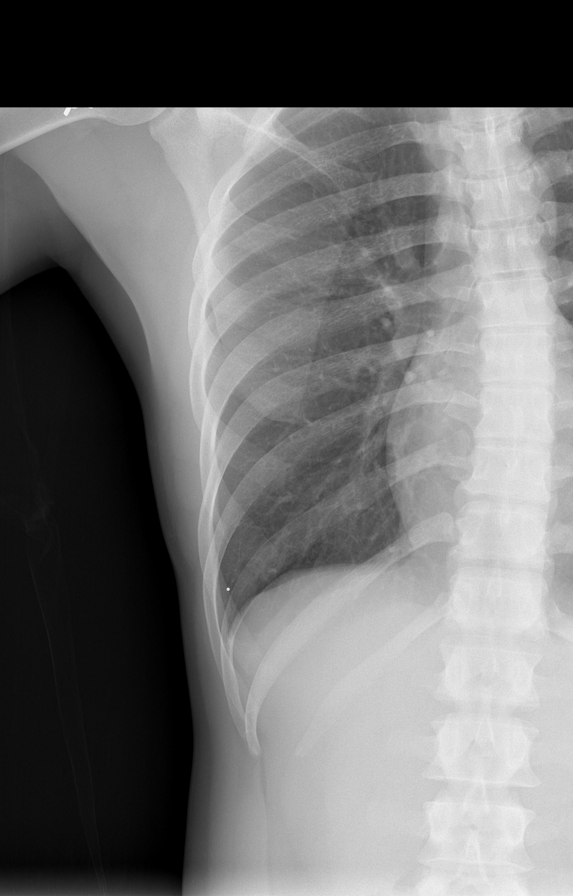

[7 of 7 positions shown; findings below may reference images not displayed]

FINDINGS: Frontal view the chest and multiple views of bilateral ribs. Midline
trachea. Normal heart size and mediastinal contours. No pleural
effusion or pneumothorax. Clear lungs.

Radiopaque marker about ninth posterior lateral right rib, without
underlying displaced rib fracture. Radiographic marker also
identified about the posterior lateral left ninth rib. No underlying
displaced rib fracture.
IMPRESSION: No displaced rib fracture, pneumothorax, or acute disease.

## 2018-09-03 ENCOUNTER — Emergency Department (HOSPITAL_BASED_OUTPATIENT_CLINIC_OR_DEPARTMENT_OTHER)
Admission: EM | Admit: 2018-09-03 | Discharge: 2018-09-03 | Disposition: A | Discharge status: Home / Self Care | Payer: Self-pay | Attending: Emergency Medicine | Admitting: Emergency Medicine

## 2018-09-03 ENCOUNTER — Other Ambulatory Visit: Payer: Self-pay

## 2018-09-03 ENCOUNTER — Encounter (HOSPITAL_BASED_OUTPATIENT_CLINIC_OR_DEPARTMENT_OTHER): Payer: Self-pay

## 2018-09-03 DIAGNOSIS — Y999 Unspecified external cause status: Secondary | ICD-10-CM | POA: Insufficient documentation

## 2018-09-03 DIAGNOSIS — Z79899 Other long term (current) drug therapy: Secondary | ICD-10-CM | POA: Insufficient documentation

## 2018-09-03 DIAGNOSIS — S0502XA Injury of conjunctiva and corneal abrasion without foreign body, left eye, initial encounter: Secondary | ICD-10-CM | POA: Insufficient documentation

## 2018-09-03 DIAGNOSIS — F17210 Nicotine dependence, cigarettes, uncomplicated: Secondary | ICD-10-CM | POA: Insufficient documentation

## 2018-09-03 DIAGNOSIS — Y939 Activity, unspecified: Secondary | ICD-10-CM | POA: Insufficient documentation

## 2018-09-03 DIAGNOSIS — Y929 Unspecified place or not applicable: Secondary | ICD-10-CM | POA: Insufficient documentation

## 2018-09-03 MED ORDER — FLUORESCEIN SODIUM 1 MG OP STRP
1.0000 | ORAL_STRIP | Freq: Once | OPHTHALMIC | Status: AC
  Administered 2018-09-03: 1 via OPHTHALMIC
  Filled 2018-09-03: qty 1

## 2018-09-03 MED ORDER — TETRACAINE HCL 0.5 % OP SOLN
2.0000 [drp] | Freq: Once | OPHTHALMIC | Status: AC
  Administered 2018-09-03: 2 [drp] via OPHTHALMIC
  Filled 2018-09-03: qty 4

## 2018-09-03 MED ORDER — CIPROFLOXACIN HCL 0.3 % OP OINT: TOPICAL_OINTMENT | OPHTHALMIC | 0 refills | Status: DC

## 2018-09-03 NOTE — Discharge Instructions (Addendum)
It was my pleasure taking care of you today!   Call the eye doctor listed tomorrow to schedule a follow up appointment.   Return to ER for new or worsening symptoms, any additional concerns.

## 2018-09-03 NOTE — ED Notes (Signed)
NAD at this time. Pt is stable and going home.  
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
## 2019-05-24 IMAGING — CR DG FOOT COMPLETE 3+V*L*
3 series · 3 of 3 positions shown · non-contrast
Comparison: None

CLINICAL DATA: Injured LEFT foot on [REDACTED] night, pain, bruising
and swelling across top of foot and toes

EXAM:
LEFT FOOT - COMPLETE 3+ VIEW

[t foot ap left]
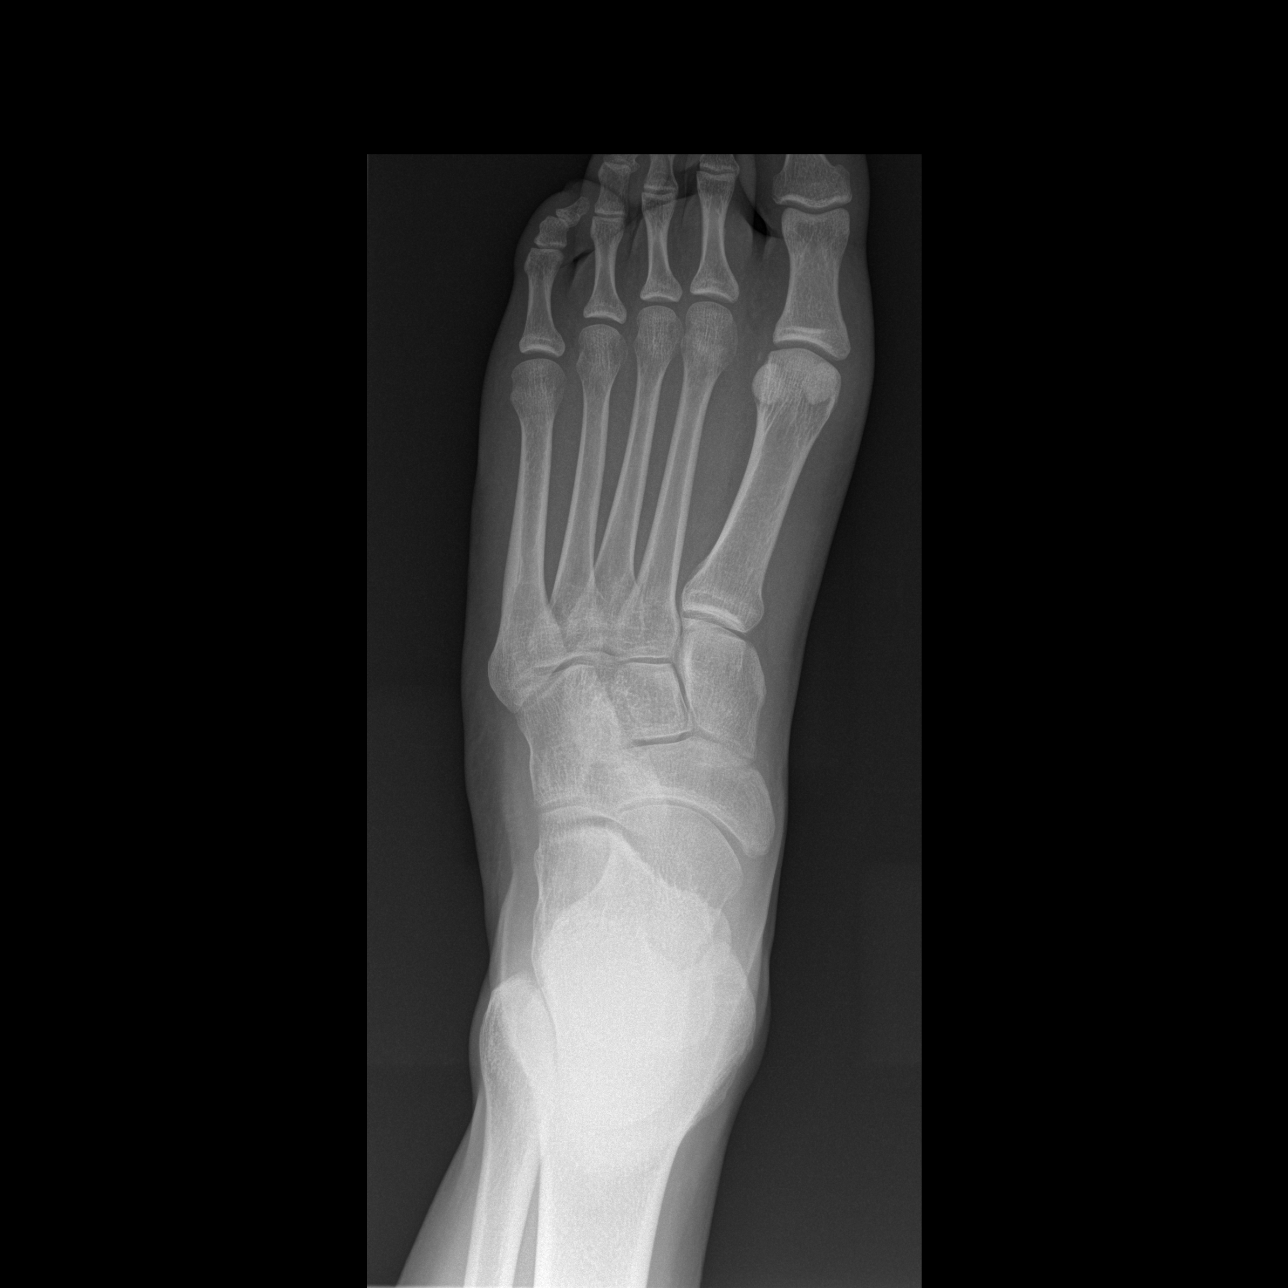

[t foot oblique left]
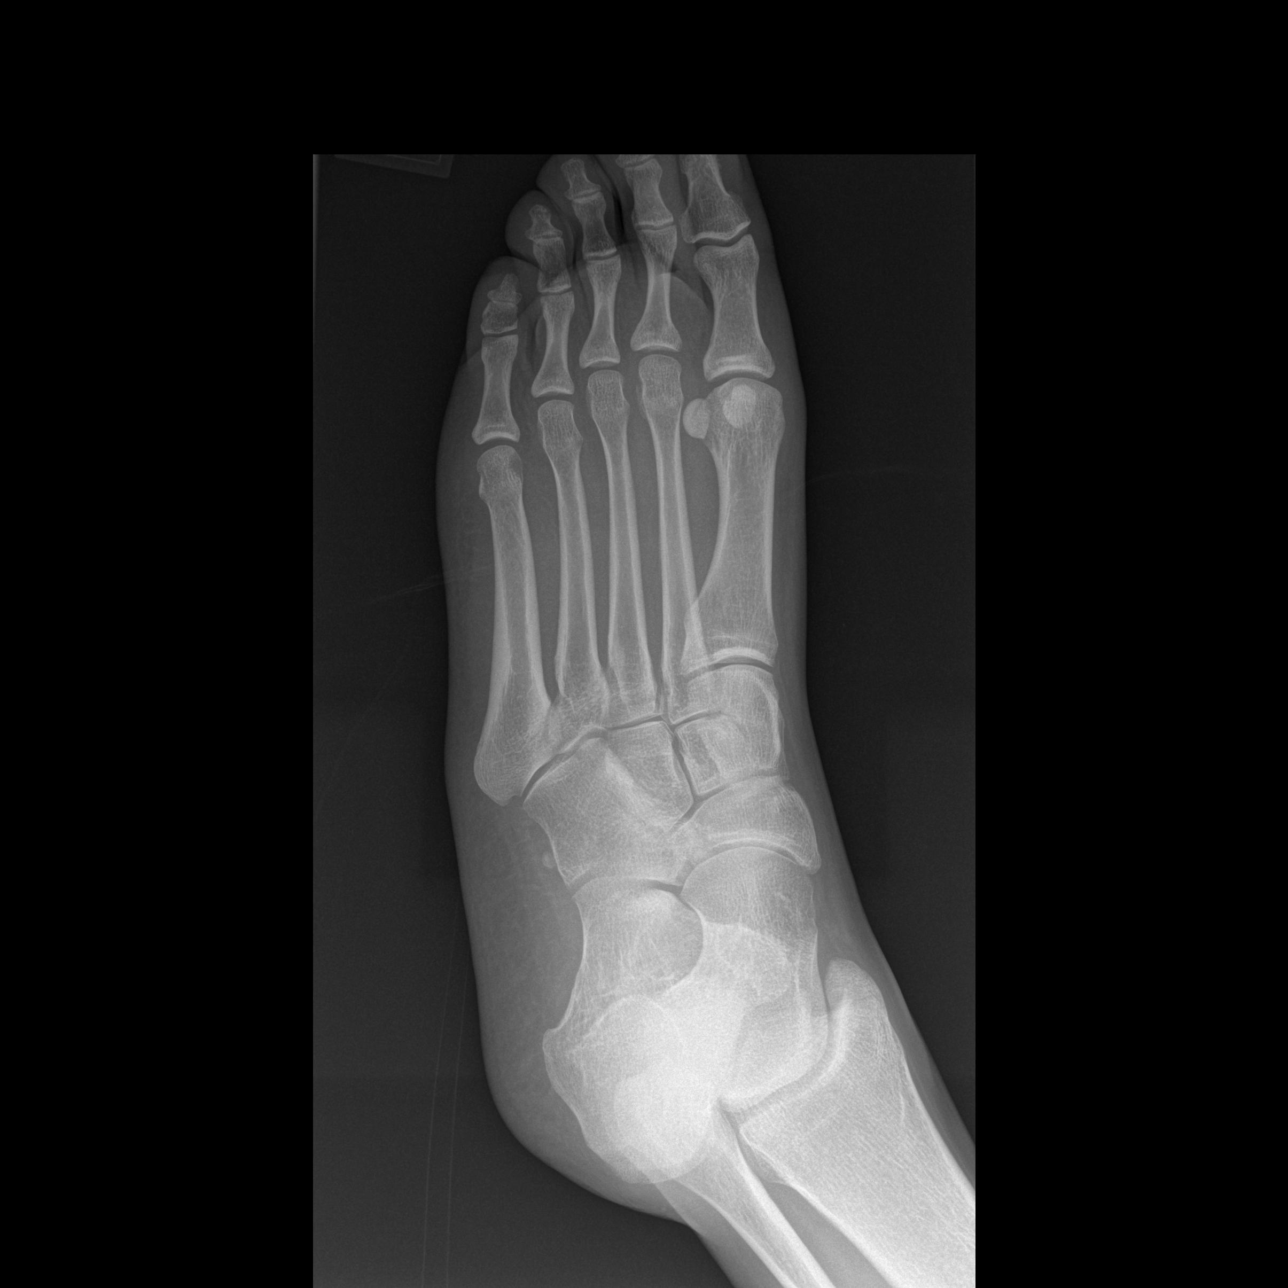

[t foot lat left]
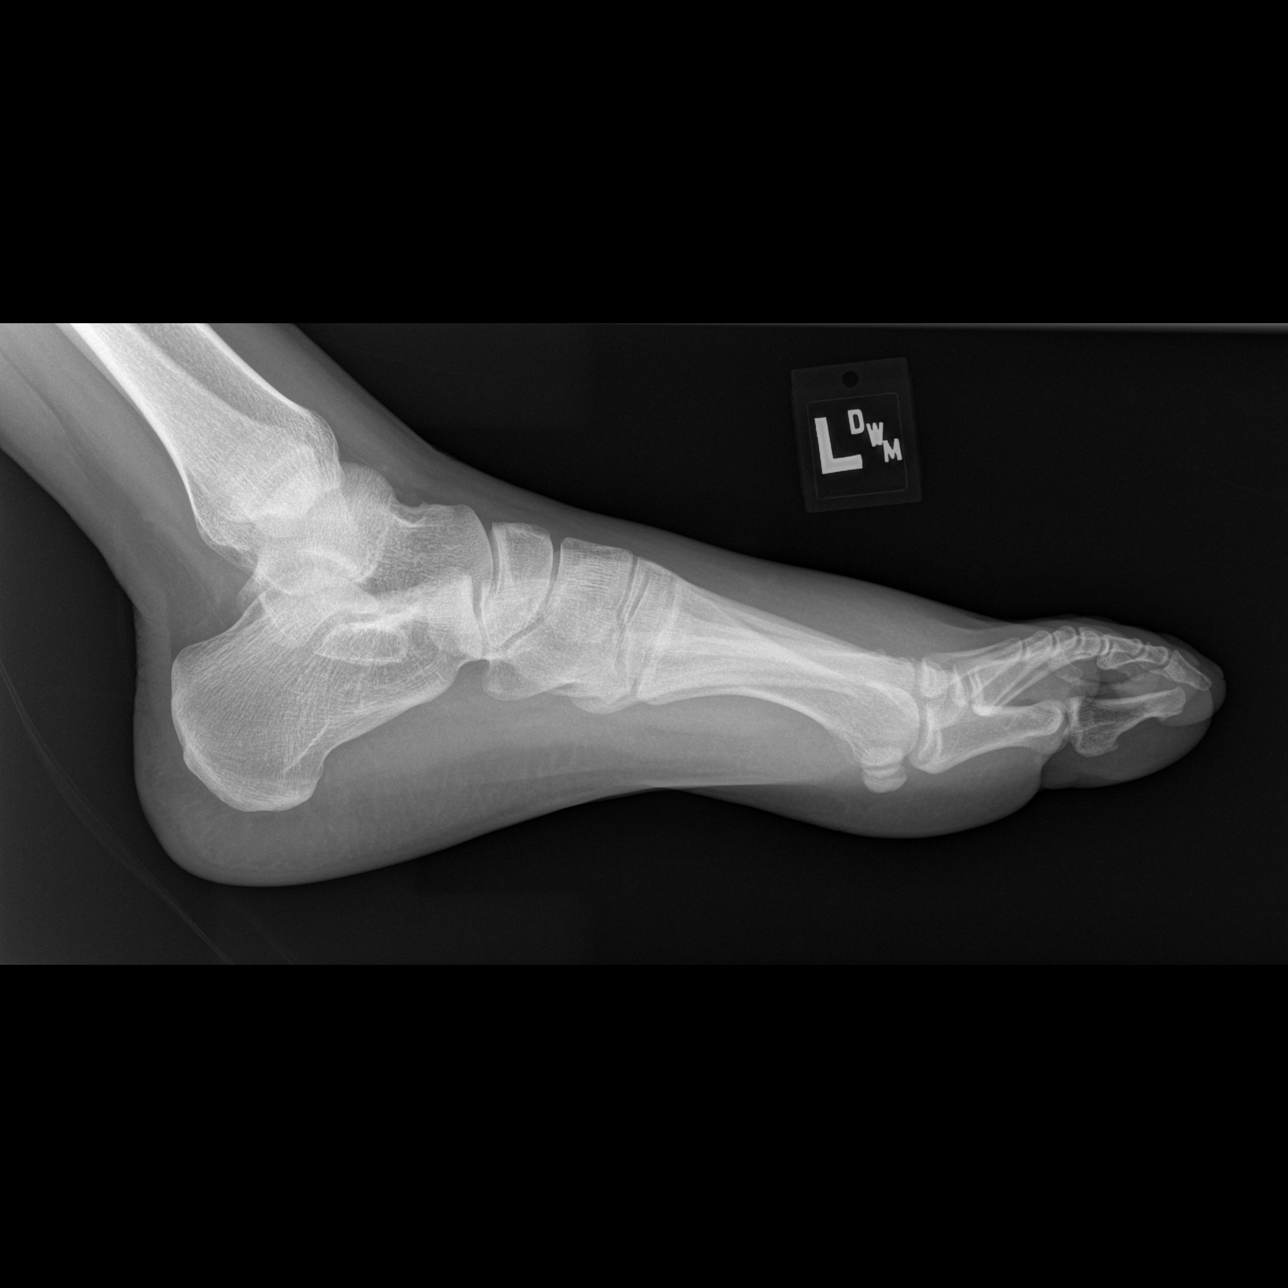

[3 of 3 positions shown; findings below may reference images not displayed]

FINDINGS: Osseous mineralization normal.

Joint spaces preserved.

No definite acute fracture, dislocation, or bone destruction.

On the oblique view, a questionable cortical discontinuity is seen
at the lateral margin of the head of the second metatarsal versus
artifact.

Mild dorsal soft tissue swelling overlying the distal metatarsals.
IMPRESSION: Questionable nondisplaced fracture versus artifact at the head of
the LEFT second metatarsal seen on a single view ; correlation for
pain/tenderness at this site recommended.

If patient is focally tender at this point, consider follow up
radiographs.

## 2021-06-21 ENCOUNTER — Other Ambulatory Visit (HOSPITAL_BASED_OUTPATIENT_CLINIC_OR_DEPARTMENT_OTHER): Payer: Self-pay

## 2021-06-21 ENCOUNTER — Other Ambulatory Visit: Payer: Self-pay

## 2021-06-21 ENCOUNTER — Emergency Department (HOSPITAL_BASED_OUTPATIENT_CLINIC_OR_DEPARTMENT_OTHER)
Admission: EM | Admit: 2021-06-21 | Discharge: 2021-06-21 | Disposition: A | Discharge status: Home / Self Care | Payer: Self-pay | Attending: Emergency Medicine | Admitting: Emergency Medicine

## 2021-06-21 ENCOUNTER — Encounter (HOSPITAL_BASED_OUTPATIENT_CLINIC_OR_DEPARTMENT_OTHER): Payer: Self-pay

## 2021-06-21 DIAGNOSIS — R369 Urethral discharge, unspecified: Secondary | ICD-10-CM | POA: Insufficient documentation

## 2021-06-21 DIAGNOSIS — F17210 Nicotine dependence, cigarettes, uncomplicated: Secondary | ICD-10-CM | POA: Insufficient documentation

## 2021-06-21 LAB — URINALYSIS, ROUTINE W REFLEX MICROSCOPIC
Bilirubin Urine: NEGATIVE
Glucose, UA: NEGATIVE mg/dL
Hgb urine dipstick: NEGATIVE
Ketones, ur: NEGATIVE mg/dL
Nitrite: NEGATIVE
Protein, ur: NEGATIVE mg/dL
Specific Gravity, Urine: 1.02 (ref 1.005–1.030)
pH: 5.5 (ref 5.0–8.0)

## 2021-06-21 LAB — WET PREP, GENITAL
Clue Cells Wet Prep HPF POC: NONE SEEN
Sperm: NONE SEEN
Trich, Wet Prep: NONE SEEN
Yeast Wet Prep HPF POC: NONE SEEN

## 2021-06-21 LAB — URINALYSIS, MICROSCOPIC (REFLEX)

## 2021-06-21 MED ORDER — LIDOCAINE HCL (PF) 1 % IJ SOLN
1.0000 mL | Freq: Once | INTRAMUSCULAR | Status: AC
  Administered 2021-06-21: 1 mL
  Filled 2021-06-21: qty 5

## 2021-06-21 MED ORDER — CEFTRIAXONE SODIUM 500 MG IJ SOLR
500.0000 mg | Freq: Once | INTRAMUSCULAR | Status: AC
  Administered 2021-06-21: 500 mg via INTRAMUSCULAR
  Filled 2021-06-21: qty 500

## 2021-06-21 MED ORDER — DOXYCYCLINE HYCLATE 100 MG PO TABS
100.0000 mg | ORAL_TABLET | Freq: Two times a day (BID) | ORAL | 0 refills | Status: DC
  Filled 2021-06-21: qty 14, 7d supply, fill #0
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
## 2021-06-21 NOTE — ED Notes (Signed)
ED Provider at bedside. 

## 2021-06-21 NOTE — ED Triage Notes (Signed)
Pt c/o penile d/c x 2 days-NAD-steady gait 

## 2021-06-22 LAB — GC/CHLAMYDIA PROBE AMP (~~LOC~~) NOT AT ARMC
Chlamydia: NEGATIVE
Comment: NEGATIVE
Comment: NORMAL
Neisseria Gonorrhea: POSITIVE — AB

## 2021-09-08 ENCOUNTER — Other Ambulatory Visit: Payer: Self-pay

## 2021-09-08 ENCOUNTER — Emergency Department (HOSPITAL_BASED_OUTPATIENT_CLINIC_OR_DEPARTMENT_OTHER)
Admission: EM | Admit: 2021-09-08 | Discharge: 2021-09-08 | Disposition: A | Discharge status: Home / Self Care | Payer: Self-pay | Attending: Emergency Medicine | Admitting: Emergency Medicine

## 2021-09-08 ENCOUNTER — Encounter (HOSPITAL_BASED_OUTPATIENT_CLINIC_OR_DEPARTMENT_OTHER): Payer: Self-pay | Admitting: Emergency Medicine

## 2021-09-08 DIAGNOSIS — M5412 Radiculopathy, cervical region: Secondary | ICD-10-CM | POA: Insufficient documentation

## 2021-09-08 DIAGNOSIS — F17210 Nicotine dependence, cigarettes, uncomplicated: Secondary | ICD-10-CM | POA: Insufficient documentation

## 2021-09-08 MED ORDER — NAPROXEN 250 MG PO TABS
500.0000 mg | ORAL_TABLET | Freq: Once | ORAL | Status: AC
  Administered 2021-09-08: 500 mg via ORAL
  Filled 2021-09-08: qty 2

## 2021-09-08 MED ORDER — HYDROCODONE-ACETAMINOPHEN 5-325 MG PO TABS: 1.0000 | ORAL_TABLET | Freq: Four times a day (QID) | ORAL | 0 refills | Status: DC | PRN

## 2021-09-08 MED ORDER — NAPROXEN 375 MG PO TABS: ORAL_TABLET | ORAL | 0 refills | Status: DC
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
## 2021-09-08 NOTE — ED Triage Notes (Signed)
Pt c/o left arm pain with numbness in hand. Pt states pain is relieved when pressing on left side of neck.

## 2021-09-14 ENCOUNTER — Ambulatory Visit: Payer: Self-pay | Admitting: Family Medicine

## 2021-09-15 ENCOUNTER — Ambulatory Visit: Payer: Self-pay | Admitting: Family Medicine

## 2022-03-15 ENCOUNTER — Other Ambulatory Visit: Payer: Self-pay

## 2022-03-15 ENCOUNTER — Emergency Department (HOSPITAL_BASED_OUTPATIENT_CLINIC_OR_DEPARTMENT_OTHER)
Admission: EM | Admit: 2022-03-15 | Discharge: 2022-03-15 | Disposition: A | Discharge status: Home / Self Care | Payer: Self-pay | Attending: Emergency Medicine | Admitting: Emergency Medicine

## 2022-03-15 ENCOUNTER — Encounter (HOSPITAL_BASED_OUTPATIENT_CLINIC_OR_DEPARTMENT_OTHER): Payer: Self-pay | Admitting: Emergency Medicine

## 2022-03-15 DIAGNOSIS — L720 Epidermal cyst: Secondary | ICD-10-CM | POA: Insufficient documentation

## 2022-03-15 DIAGNOSIS — L089 Local infection of the skin and subcutaneous tissue, unspecified: Secondary | ICD-10-CM | POA: Insufficient documentation

## 2022-03-15 DIAGNOSIS — M5412 Radiculopathy, cervical region: Secondary | ICD-10-CM | POA: Insufficient documentation

## 2022-03-15 DIAGNOSIS — F17210 Nicotine dependence, cigarettes, uncomplicated: Secondary | ICD-10-CM | POA: Insufficient documentation

## 2022-03-15 HISTORY — DX: Radiculopathy, cervical region: M54.12

## 2022-03-15 MED ORDER — DOXYCYCLINE HYCLATE 100 MG PO CAPS: 100.0000 mg | ORAL_CAPSULE | Freq: Two times a day (BID) | ORAL | 0 refills | Status: DC

## 2022-03-15 MED ORDER — LIDOCAINE-EPINEPHRINE (PF) 2 %-1:200000 IJ SOLN
INTRAMUSCULAR | Status: AC
  Filled 2022-03-15: qty 20

## 2022-03-15 MED ORDER — HYDROCODONE-ACETAMINOPHEN 5-325 MG PO TABS: 1.0000 | ORAL_TABLET | ORAL | 0 refills | Status: DC | PRN

## 2022-03-15 MED ORDER — DOXYCYCLINE HYCLATE 100 MG PO TABS
100.0000 mg | ORAL_TABLET | Freq: Once | ORAL | Status: AC
  Administered 2022-03-15: 100 mg via ORAL
  Filled 2022-03-15: qty 1

## 2022-03-15 MED ORDER — HYDROCODONE-ACETAMINOPHEN 5-325 MG PO TABS
1.0000 | ORAL_TABLET | Freq: Once | ORAL | Status: AC
  Administered 2022-03-15: 1 via ORAL
  Filled 2022-03-15: qty 1
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
## 2022-03-17 LAB — AEROBIC/ANAEROBIC CULTURE W GRAM STAIN (SURGICAL/DEEP WOUND)

## 2022-03-20 LAB — AEROBIC/ANAEROBIC CULTURE W GRAM STAIN (SURGICAL/DEEP WOUND)

## 2022-03-21 ENCOUNTER — Telehealth: Payer: Self-pay

## 2022-03-21 NOTE — Telephone Encounter (Signed)
Post ED Visit - Positive Culture Follow-up: Unsuccessful Patient Follow-up  Culture assessed and recommendations reviewed by:  []  , Pharm.D. []  Enzo Bi, Pharm.D., BCPS AQ-ID []  , Pharm.D., BCPS []  Celedonio Miyamoto, Pharm.D., BCPS []  [place], Garvin Fila.D., BCPS, AAHIVP []  , Pharm.D., BCPS, AAHIVP []  Georgina Pillion, PharmD []  , PharmD, BCPS  Positive wound culture  []  Patient discharged without antimicrobial prescription and treatment is now indicated [x]  Organism is resistant to prescribed ED discharge antimicrobial []  Patient with positive blood cultures  Plan stop taking Doxycycline Hyclate and start Amoxicillin 500 mg po TID x 7 days per ED provider [place], PA-C  Unable to contact patient after 3 attempts, letter will be sent to address on file  [address] 03/21/2022, 5:08 PM

## 2022-03-21 NOTE — Progress Notes (Signed)
ED Antimicrobial Stewardship Positive Culture Follow Up   Christopher Roman is an 41 y.o. male who presented to [facility] on 03/15/2022 with a chief complaint of  Chief Complaint  Patient presents with   Abscess    Recent Results (from the past 720 hour(s))  Aerobic/Anaerobic Culture w Gram Stain (surgical/deep wound)     Status: None   Collection Time: 03/15/22  5:20 AM   Specimen: Face; Wound  Result Value Ref Range Status   Specimen Description   Final    FACE Performed at [facility], [address] [place], [place] [address]    Special Requests   Final    NONE Performed at [facility], [address] [place], [place] [address]    Gram Stain   Final    RARE WBC PRESENT, PREDOMINANTLY PMN RARE GRAM POSITIVE COCCI IN PAIRS    Culture   Final    MODERATE STREPTOCOCCUS ANGINOSIS NO ANAEROBES ISOLATED Performed at [facility], [address], [place], [place] [address]    Report Status 03/20/2022 FINAL  Final   Organism ID, Bacteria STREPTOCOCCUS ANGINOSIS  Final      Susceptibility   Streptococcus anginosis - MIC*    PENICILLIN <=0.06 SENSITIVE Sensitive     CEFTRIAXONE 0.25 SENSITIVE Sensitive     ERYTHROMYCIN <=0.12 SENSITIVE Sensitive     LEVOFLOXACIN <=0.25 SENSITIVE Sensitive     VANCOMYCIN 0.25 SENSITIVE Sensitive     * MODERATE STREPTOCOCCUS ANGINOSIS    [x]  Treated with doxycycline, organism resistant to prescribed antimicrobial []  Patient discharged originally without antimicrobial agent and treatment is now indicated  New antibiotic prescription: Discontinue doxycycline, start amoxicillin 500mg  PO TID x 7 days  ED Provider: , PA-C   Leta Bucklin, 03/21/2022, 8:35 AM Clinical Pharmacist Monday - Friday phone -  [phone] Saturday - Sunday phone - [phone]

## 2022-04-17 ENCOUNTER — Other Ambulatory Visit: Payer: Self-pay

## 2022-04-17 ENCOUNTER — Emergency Department (HOSPITAL_BASED_OUTPATIENT_CLINIC_OR_DEPARTMENT_OTHER)
Admission: EM | Admit: 2022-04-17 | Discharge: 2022-04-17 | Disposition: A | Discharge status: Home / Self Care | Payer: Self-pay | Attending: Emergency Medicine | Admitting: Emergency Medicine

## 2022-04-17 ENCOUNTER — Encounter (HOSPITAL_BASED_OUTPATIENT_CLINIC_OR_DEPARTMENT_OTHER): Payer: Self-pay | Admitting: Emergency Medicine

## 2022-04-17 ENCOUNTER — Emergency Department (HOSPITAL_BASED_OUTPATIENT_CLINIC_OR_DEPARTMENT_OTHER): Payer: Self-pay

## 2022-04-17 DIAGNOSIS — N3001 Acute cystitis with hematuria: Secondary | ICD-10-CM | POA: Insufficient documentation

## 2022-04-17 LAB — CBC WITH DIFFERENTIAL/PLATELET
Abs Immature Granulocytes: 0.12 10*3/uL — ABNORMAL HIGH (ref 0.00–0.07)
Basophils Absolute: 0 10*3/uL (ref 0.0–0.1)
Basophils Relative: 0 %
Eosinophils Absolute: 0.1 10*3/uL (ref 0.0–0.5)
Eosinophils Relative: 0 %
HCT: 43.8 % (ref 39.0–52.0)
Hemoglobin: 15.3 g/dL (ref 13.0–17.0)
Immature Granulocytes: 1 %
Lymphocytes Relative: 8 %
Lymphs Abs: 1.7 10*3/uL (ref 0.7–4.0)
MCH: 28.9 pg (ref 26.0–34.0)
MCHC: 34.9 g/dL (ref 30.0–36.0)
MCV: 82.8 fL (ref 80.0–100.0)
Monocytes Absolute: 1.6 10*3/uL — ABNORMAL HIGH (ref 0.1–1.0)
Monocytes Relative: 7 %
Neutro Abs: 18.7 10*3/uL — ABNORMAL HIGH (ref 1.7–7.7)
Neutrophils Relative %: 84 %
Platelets: 227 10*3/uL (ref 150–400)
RBC: 5.29 MIL/uL (ref 4.22–5.81)
RDW: 12.5 % (ref 11.5–15.5)
WBC: 22.2 10*3/uL — ABNORMAL HIGH (ref 4.0–10.5)
nRBC: 0 % (ref 0.0–0.2)

## 2022-04-17 LAB — URINALYSIS, ROUTINE W REFLEX MICROSCOPIC
Bilirubin Urine: NEGATIVE
Glucose, UA: NEGATIVE mg/dL
Ketones, ur: NEGATIVE mg/dL
Nitrite: NEGATIVE
Protein, ur: 100 mg/dL — AB
Specific Gravity, Urine: 1.025 (ref 1.005–1.030)
pH: 5.5 (ref 5.0–8.0)

## 2022-04-17 LAB — BASIC METABOLIC PANEL
Anion gap: 8 (ref 5–15)
BUN: 10 mg/dL (ref 6–20)
CO2: 28 mmol/L (ref 22–32)
Calcium: 10.2 mg/dL (ref 8.9–10.3)
Chloride: 100 mmol/L (ref 98–111)
Creatinine, Ser: 1.12 mg/dL (ref 0.61–1.24)
GFR, Estimated: >60 mL/min (ref >60)
Glucose, Bld: 128 mg/dL — ABNORMAL HIGH (ref 70–99)
Potassium: 4 mmol/L (ref 3.5–5.1)
Sodium: 136 mmol/L (ref 135–145)

## 2022-04-17 LAB — URINALYSIS, MICROSCOPIC (REFLEX): WBC, UA: >50 WBC/hpf (ref 0–5)

## 2022-04-17 LAB — LIPASE, BLOOD: Lipase: 18 U/L (ref 11–51)

## 2022-04-17 LAB — LACTIC ACID, PLASMA: Lactic Acid, Venous: 1.1 mmol/L (ref 0.5–1.9)

## 2022-04-17 MED ORDER — CIPROFLOXACIN HCL 500 MG PO TABS: 500.0000 mg | ORAL_TABLET | Freq: Two times a day (BID) | ORAL | 0 refills | Status: AC

## 2022-04-17 MED ORDER — ACETAMINOPHEN 325 MG PO TABS
650.0000 mg | ORAL_TABLET | Freq: Once | ORAL | Status: AC
  Administered 2022-04-17: 650 mg via ORAL
  Filled 2022-04-17: qty 2

## 2022-04-17 MED ORDER — IOHEXOL 300 MG/ML  SOLN
100.0000 mL | Freq: Once | INTRAMUSCULAR | Status: AC | PRN
  Administered 2022-04-17: 100 mL via INTRAVENOUS

## 2022-04-17 MED ORDER — SODIUM CHLORIDE 0.9 % IV SOLN
1.0000 g | Freq: Once | INTRAVENOUS | Status: AC
  Administered 2022-04-17: 1 g via INTRAVENOUS
  Filled 2022-04-17: qty 10

## 2022-04-17 MED ORDER — SODIUM CHLORIDE 0.9 % IV BOLUS
1000.0000 mL | Freq: Once | INTRAVENOUS | Status: AC
  Administered 2022-04-17: 1000 mL via INTRAVENOUS

## 2022-04-17 NOTE — ED Provider Notes (Signed)
[facility] EMERGENCY DEPARTMENT Provider Note   CSN: [id number] Arrival date & time: 04/17/22  1357     History PMH: Cervical Radiculopathy Chief Complaint  Patient presents with   Dysuria   Fever    Christopher Roman is a 41 y.o. male. Patient presents the ED with decreased urination, increased pressure in his suprapubic region, and bilateral flank pain that has been ongoing for 2 days now.  He says that he has noticed at the end of his urine stream he gets a feeling of warmth.  He denies any discharge from his penis, or scrotal swelling or pain.  He has had STD in the past, and does not feel that this is similar.  He has not really concern for STDs at this time.  He says he has pressure in his lower abdomen, but also has some epigastric abdominal pain, decreased appetite, and 3 episodes of emesis yesterday.  He feels that he has had some fevers at home.  He also says that both of his flanks are aching.  He feels that he is not able to empty his bladder and is only urinating small amounts each time.  He constantly has the feeling that he has urine still in his bladder. He has never had the symptoms before.  Tried taking a laxative yesterday because he thought he might be constipated but this did not really help.   Dysuria Presenting symptoms: dysuria   Presenting symptoms: no penile discharge   Associated symptoms: abdominal pain, fever, flank pain, nausea and vomiting   Associated symptoms: no hematuria   Fever Associated symptoms: chills, dysuria, nausea and vomiting   Associated symptoms: no chest pain        Home Medications Prior to Admission medications   Medication Sig Start Date End Date Taking? Authorizing Provider  ciprofloxacin (CIPRO) 500 MG tablet Take 1 tablet (500 mg total) by mouth 2 (two) times daily for 10 days. 04/17/22 04/27/22 Yes Juan Kissoon, Finis Bud, PA-C  doxycycline (VIBRAMYCIN) 100 MG capsule Take 1 capsule (100 mg total) by mouth 2 (two) times daily.  One po bid x 7 days 03/15/22   Molpus, John, MD  HYDROcodone-acetaminophen (NORCO) 5-325 MG tablet Take 1 tablet by mouth every 4 (four) hours as needed for severe pain. 03/15/22   Molpus, Jonny Ruiz, MD      Allergies    Patient has no known allergies.    Review of Systems   Review of Systems  Constitutional:  Positive for chills and fever.  Respiratory:  Negative for shortness of breath.   Cardiovascular:  Negative for chest pain and leg swelling.  Gastrointestinal:  Positive for abdominal pain, nausea and vomiting. Negative for constipation.  Genitourinary:  Positive for decreased urine volume, difficulty urinating, dysuria and flank pain. Negative for hematuria and penile discharge.  All other systems reviewed and are negative.   Physical Exam Updated Vital Signs BP 121/71   Pulse 67   Temp 100.3 F (37.9 C)   Resp 17   Ht 5\' 11"  (1.803 m)   Wt 61.2 kg   SpO2 99%   BMI 18.83 kg/m  Physical Exam Vitals and nursing note reviewed.  Constitutional:      General: He is not in acute distress.    Appearance: Normal appearance. He is not ill-appearing, toxic-appearing or diaphoretic.  HENT:     Head: Normocephalic and atraumatic.     Nose: No nasal deformity.     Mouth/Throat:     Lips: Pink. No  lesions.     Mouth: Mucous membranes are moist. No injury, lacerations, oral lesions or angioedema.     Pharynx: Oropharynx is clear. Uvula midline. No pharyngeal swelling, oropharyngeal exudate, posterior oropharyngeal erythema or uvula swelling.  Eyes:     General: Gaze aligned appropriately. No scleral icterus.       Right eye: No discharge.        Left eye: No discharge.     Conjunctiva/sclera: Conjunctivae normal.     Right eye: Right conjunctiva is not injected. No exudate or hemorrhage.    Left eye: Left conjunctiva is not injected. No exudate or hemorrhage.    Pupils: Pupils are equal, round, and reactive to light.  Cardiovascular:     Rate and Rhythm: Normal rate and regular  rhythm.     Pulses: Normal pulses.          Radial pulses are 2+ on the right side and 2+ on the left side.       Dorsalis pedis pulses are 2+ on the right side and 2+ on the left side.     Heart sounds: Normal heart sounds, S1 normal and S2 normal. Heart sounds not distant. No murmur heard.    No friction rub. No gallop. No S3 or S4 sounds.  Pulmonary:     Effort: Pulmonary effort is normal. No accessory muscle usage or respiratory distress.     Breath sounds: Normal breath sounds. No stridor. No wheezing, rhonchi or rales.  Chest:     Chest wall: No tenderness.  Abdominal:     General: Abdomen is flat. There is distension.     Palpations: Abdomen is soft. There is no mass or pulsatile mass.     Tenderness: There is abdominal tenderness. There is right CVA tenderness and left CVA tenderness. There is no guarding or rebound.     Hernia: No hernia is present.     Comments: He does have some mild suprapubic distention as well as tenderness overlying this area.  He also has some epigastric tenderness that is mild as well as right upper quadrant.  Murphy sign is negative.  He has minimal bilateral CVA tenderness.  Musculoskeletal:     Right lower leg: No edema.     Left lower leg: No edema.  Skin:    General: Skin is warm and dry.     Coloration: Skin is not jaundiced or pale.     Findings: No bruising, erythema, lesion or rash.  Neurological:     General: No focal deficit present.     Mental Status: He is alert and oriented to person, place, and time.     GCS: GCS eye subscore is 4. GCS verbal subscore is 5. GCS motor subscore is 6.  Psychiatric:        Mood and Affect: Mood normal.        Behavior: Behavior normal. Behavior is cooperative.     ED Results / Procedures / Treatments   Labs (all labs ordered are listed, but only abnormal results are displayed) Labs Reviewed  BASIC METABOLIC PANEL - Abnormal; Notable for the following components:      Result Value   Glucose, Bld 128  (*)    All other components within normal limits  URINALYSIS, ROUTINE W REFLEX MICROSCOPIC - Abnormal; Notable for the following components:   APPearance CLOUDY (*)    Hgb urine dipstick MODERATE (*)    Protein, ur 100 (*)    Leukocytes,Ua LARGE (*)  All other components within normal limits  CBC WITH DIFFERENTIAL/PLATELET - Abnormal; Notable for the following components:   WBC 22.2 (*)    Neutro Abs 18.7 (*)    Monocytes Absolute 1.6 (*)    Abs Immature Granulocytes 0.12 (*)    All other components within normal limits  URINALYSIS, MICROSCOPIC (REFLEX) - Abnormal; Notable for the following components:   Bacteria, UA FEW (*)    Non Squamous Epithelial PRESENT (*)    All other components within normal limits  URINE CULTURE  CULTURE, BLOOD (ROUTINE X 2)  CULTURE, BLOOD (ROUTINE X 2)  LACTIC ACID, PLASMA  LIPASE, BLOOD  LACTIC ACID, PLASMA  GC/CHLAMYDIA PROBE AMP ([place]) NOT AT [facility]    EKG None  Radiology CT Abdomen Pelvis W Contrast  Result Date: 04/17/2022 CLINICAL DATA:  Flank pain, kidney stone suspected, right lower quadrant abdominal pain. EXAM: CT ABDOMEN AND PELVIS WITH CONTRAST TECHNIQUE: Multidetector CT imaging of the abdomen and pelvis was performed using the standard protocol following bolus administration of intravenous contrast. RADIATION DOSE REDUCTION: This exam was performed according to the departmental dose-optimization program which includes automated exposure control, adjustment of the mA and/or kV according to patient size and/or use of iterative reconstruction technique. CONTRAST:  OMNIPAQUE IOHEXOL 300 MG/ML  SOLN COMPARISON:  None Available. FINDINGS: Lower chest: No acute abnormality. Hepatobiliary: No focal liver abnormality is seen. No gallstones, gallbladder wall thickening, or biliary dilatation. Pancreas: Unremarkable. No pancreatic ductal dilatation or surrounding inflammatory changes. Spleen: Normal in size without focal abnormality.  Adrenals/Urinary Tract: Adrenal glands are unremarkable. Kidneys are normal, without renal calculi, focal lesion, or hydronephrosis. Mild thickening of the urinary bladder wall, which may be secondary to under distention. Stomach/Bowel: Stomach is within normal limits. Appendix not definitely identified, however no inflammatory changes in the pericecal region. No evidence of bowel wall thickening, distention, or inflammatory changes. Vascular/Lymphatic: Mild aortic atherosclerosis. No enlarged abdominal or pelvic lymph nodes. Reproductive: Prostate is unremarkable. Other: No abdominal wall hernia or abnormality. No abdominopelvic ascites. Musculoskeletal: No acute or significant osseous findings. IMPRESSION: 1. No evidence of nephrolithiasis or hydronephrosis. No ureteral calculus or evidence of recently passed calculus. 2. Mild thickening of the urinary bladder wall, which may be secondary to under distention, correlate with urinalysis if clinical concern for urinary tract infection. 3. Bowel loops are normal in caliber. No evidence of appendicitis, colitis or diverticulitis. Electronically Signed   By: Larose Hires D.O.   On: 04/17/2022 17:08    Procedures Procedures   Medications Ordered in ED Medications  acetaminophen (TYLENOL) tablet 650 mg (650 mg Oral Given 04/17/22 1600)  cefTRIAXone (ROCEPHIN) 1 g in sodium chloride 0.9 % 100 mL IVPB (0 g Intravenous Stopped 04/17/22 1733)  sodium chloride 0.9 % bolus 1,000 mL (0 mLs Intravenous Stopped 04/17/22 1741)  iohexol (OMNIPAQUE) 300 MG/ML solution 100 mL (100 mLs Intravenous Contrast Given 04/17/22 1644)    ED Course/ Medical Decision Making/ A&P Clinical Course as of 04/17/22 1821  Tue Apr 17, 2022  1632 0 cc in bladder on bladder scan [GL]  1748 Normal lactate. I do not think patient needs admission as he looks well, has stable vitals, symptoms are under control. He is not septic. Got single dose of rocephin here.  [GL]    Clinical Course User  Index [GL] Sagar Tengan, Finis Bud, PA-C  Medical Decision Making Amount and/or Complexity of Data Reviewed Labs: ordered. Radiology: ordered.  Risk OTC drugs. Prescription drug management.    MDM  This is a 41 y.o. male who presents to the ED with fever, difficulty with urination, bilateral flank pain, upper abdominal pain, with n/v The differential of this patient includes but is not limited to UTI, Pyelonephritis, STD, Gallbladder etiology, appendicitis, etc.  Initial Impression  Well appearing. Temp 100.3, HDS otherwise. Initial labs were obtained in triage.   I personally ordered, reviewed, and interpreted all laboratory work and imaging and agree with radiologist interpretation. Results interpreted below: WBC 22.2, left shift BMP stable UA with Moderate Hcg, Large LE, but few bacteria Lactate normal Urine Culture pending G/C Pending Lipase normal  Assessment/Plan:  UA does have a lot of leukocytes but not many bacteria. With fever and elevated wbc, blood and urine cultures were obtained. Lactate ordered. Added on G/C. Bladder scan negative for urine retention. Will give single dose of Rocephin to cover for UTI and start on IVF. Will also order CT a/p to r/o appendicitis, evaluate gallbladder, and look for other intraabdominal pathology.  CT a/p does not fully visualize appendix but no secondary sign of appendicitis. Gallbladder without thickening or stones suggestive of cholecystitis. No bowel obstruction or abnormality. There is some bladder wall thickening that is consistent with retention versus cystitis. No hydronephrosis, signs of pyelonephritis, or kidney stones.   After reviewing CT, I think patient's picture is most consistent with UTI. Doubt appendicitis due to no CT findings and also no focal RLQ tenderness. He does have the elevated wbc, but lactate is normal and no signs of sepsis. He has pending blood and urine cultures as well as STD testing.  He has remained hemodynamically stable here. He has no other comorbidities other than being male that makes him high risk for decompensation. I do not think that he requires admission. I will prescribe him Cipro for 10 days. He is given strict return precautions.    Charting Requirements Additional history is obtained from:  Independent historian External Records from outside source obtained and reviewed including: prior labs Social Determinants of Health:  none Pertinant PMH that complicates patient's illness: n/a  Patient Care Problems that were addressed during this visit: - UTI: Acute illness with systemic symptoms This patient was maintained on a cardiac monitor/telemetry. I personally viewed and interpreted the cardiac monitor which reveals an underlying rhythm of NSR Medications given in ED: IVF, Tylenol, Rocephin Reevaluation of the patient after these medicines showed that the patient improved I have reviewed home medications and made changes accordingly.  Critical Care Interventions: n/a Consultations: n/a Disposition: discharge  Portions of this note were generated with Dragon dictation software. Dictation errors may occur despite best attempts at proofreading.   Final Clinical Impression(s) / ED Diagnoses Final diagnoses:  Acute cystitis with hematuria    Rx / DC Orders ED Discharge Orders          Ordered    ciprofloxacin (CIPRO) 500 MG tablet  2 times daily        04/17/22 1750              Ophelia Sipe, Finis Bud, PA-C 04/17/22 1821    Rolan Bucco, MD 04/17/22 2252

## 2022-04-17 NOTE — ED Triage Notes (Signed)
Dysuria and fever for a few days. Concerned for UTI. Denies penile discharge.

## 2022-04-17 NOTE — Discharge Instructions (Addendum)
You have a Urinary Tract Infection. You have received one dose of IV antibiotics while you are here in our ED. I have also prescribed you a 10 day course of antibiotics for home. Please pick these up at your pharmacy and take as prescribed.   If you do not start having any improvement or if you worsen while on antibiotics, please return to the emergency department immediately as you may require IV antibiotics and admission.

## 2022-04-17 NOTE — ED Notes (Signed)
0 mls in bladder

## 2022-04-18 LAB — GC/CHLAMYDIA PROBE AMP (~~LOC~~) NOT AT ARMC
Chlamydia: NEGATIVE
Comment: NEGATIVE
Comment: NORMAL
Neisseria Gonorrhea: NEGATIVE

## 2022-04-19 LAB — URINE CULTURE: Culture: 50000 — AB

## 2022-04-20 ENCOUNTER — Telehealth: Payer: Self-pay

## 2022-04-20 NOTE — Telephone Encounter (Signed)
Post ED Visit - Positive Culture Follow-up  Culture report reviewed by antimicrobial stewardship pharmacist: [facility] [x]  [place], Pharm.D. []  Francene Finders, Pharm.D., BCPS AQ-ID []  , Pharm.D., BCPS []  Celedonio Miyamoto, Pharm.D., BCPS []  [place], Garvin Fila.D., BCPS, AAHIVP []  , Pharm.D., BCPS, AAHIVP []  Georgina Pillion, PharmD, BCPS []  , PharmD, BCPS []  [place], PharmD, BCPS []  [address], PharmD []  , PharmD, BCPS []  Estella Husk, PharmD  Pharmacy Team []  Lysle Pearl, PharmD []  , PharmD []  Phillips Climes, PharmD []  , Rph []  Agapito Games) , PharmD []  Verlan Friends, PharmD []  , PharmD []  Mervyn Gay, PharmD []  , PharmD []  Vinnie Level, PharmD []  [facility], PharmD []  , PharmD []  Len Childs, PharmD   Positive urine culture Treated with Ciprofloxacin, organism sensitive to the same and no further patient follow-up is required at this time.  04/20/2022, 10:07 AM

## 2022-04-22 LAB — CULTURE, BLOOD (ROUTINE X 2)
Culture: NO GROWTH
Special Requests: ADEQUATE

## 2022-05-20 ENCOUNTER — Emergency Department (HOSPITAL_BASED_OUTPATIENT_CLINIC_OR_DEPARTMENT_OTHER)
Admission: EM | Admit: 2022-05-20 | Discharge: 2022-05-20 | Disposition: A | Discharge status: Home / Self Care | Payer: Self-pay | Attending: Emergency Medicine | Admitting: Emergency Medicine

## 2022-05-20 ENCOUNTER — Encounter (HOSPITAL_BASED_OUTPATIENT_CLINIC_OR_DEPARTMENT_OTHER): Payer: Self-pay | Admitting: Emergency Medicine

## 2022-05-20 ENCOUNTER — Other Ambulatory Visit: Payer: Self-pay

## 2022-05-20 DIAGNOSIS — F17210 Nicotine dependence, cigarettes, uncomplicated: Secondary | ICD-10-CM | POA: Insufficient documentation

## 2022-05-20 DIAGNOSIS — R438 Other disturbances of smell and taste: Secondary | ICD-10-CM | POA: Insufficient documentation

## 2022-05-20 MED ORDER — PANTOPRAZOLE SODIUM 40 MG PO TBEC
40.0000 mg | DELAYED_RELEASE_TABLET | Freq: Once | ORAL | Status: AC
  Administered 2022-05-20: 40 mg via ORAL
  Filled 2022-05-20: qty 1

## 2022-05-20 MED ORDER — ESOMEPRAZOLE MAGNESIUM 40 MG PO CPDR: 40.0000 mg | DELAYED_RELEASE_CAPSULE | Freq: Every day | ORAL | 0 refills | Status: AC

## 2022-05-20 NOTE — ED Provider Notes (Signed)
[facility] Provider Note: Christopher Dell, MD, FACEP  CSN: [id number] MRN: [id number] ARRIVAL: 05/20/22 at 0205 ROOM: MH08/[facility]08   CHIEF COMPLAINT  Bad Taste in Mouth   HISTORY OF PRESENT ILLNESS  05/20/22 3:57 AM Christopher Roman is a 41 y.o. male who had an infected dermoid cyst beneath his left earlobe incised and drained by myself on 03/15/2022.  He was placed on doxycycline.  He was treated on 04/17/2022 for dysuria and was negative for gonorrhea and chlamydia.  Urine culture grew out E. coli, pansensitive for tested antibiotics except for trimethoprim/sulfamethoxazole and ampicillin.  He was treated with ciprofloxacin.   About a week after he completed his course of ciprofloxacin he began having an unpleasant taste in his mouth when he awakened in the morning.  He only has this foul taste in the mornings.  He has difficulty describing what he means by foul taste.  The cyst on his left face has completely healed and he no longer has any urinary symptoms.   Past Medical History:  Diagnosis Date   Cervical radiculopathy     History reviewed. No pertinent surgical history.  Family History  Problem Relation Age of Onset   Hypertension Mother    Hypertension Sister    Hypertension Other     Social History   Tobacco Use   Smoking status: Every Day    Packs/day: 0.50    Types: Cigarettes   Smokeless tobacco: Never  Vaping Use   Vaping Use: Never used  Substance Use Topics   Alcohol use: Yes    Comment: sometimes   Drug use: Yes    Types: Marijuana    Comment: sometimes    Prior to Admission medications   Medication Sig Start Date End Date Taking? Authorizing Provider  esomeprazole (NEXIUM) 40 MG capsule Take 1 capsule (40 mg total) by mouth daily. 05/20/22  Yes Ruhama Lehew, MD    Allergies Patient has no known allergies.   REVIEW OF SYSTEMS  Negative except as noted here or in the History of Present Illness.   PHYSICAL EXAMINATION  Initial Vital  Signs Blood pressure (!) 128/94, pulse 85, temperature 98 F (36.7 C), resp. rate 18, height 5\' 11"  (1.803 m), weight 61.7 kg, SpO2 98 %.  Examination General: Well-developed, well-nourished male in no acute distress; appearance consistent with age of record HENT: normocephalic; atraumatic; healing of the previously treated facial cyst with minimal scarring and no residual mass Eyes: Normal appearance Neck: supple Heart: regular rate and rhythm Lungs: clear to auscultation bilaterally Abdomen: soft; nondistended; nontender; bowel sounds present Extremities: No deformity; full range of motion Neurologic: Awake, alert and oriented; motor function intact in all extremities and symmetric; no facial droop Skin: Warm and dry Psychiatric: Normal mood and affect   RESULTS  Summary of this visit's results, reviewed and interpreted by myself:   EKG Interpretation  Date/Time:    Ventricular Rate:    PR Interval:    QRS Duration:   QT Interval:    QTC Calculation:   R Axis:     Text Interpretation:         Laboratory Studies: No results found for this or any previous visit (from the past 24 hour(s)). Imaging Studies: No results found.  ED COURSE and MDM  Nursing notes, initial and subsequent vitals signs, including pulse oximetry, reviewed and interpreted by myself.  Vitals:   05/20/22 0222 05/20/22 0222  BP:  (!) 128/94  Pulse:  85  Resp:  18  Temp:  98 F (36.7 C)  SpO2:  98%  Weight: 61.7 kg   Height: 5\' 11"  (1.803 m)    Medications  pantoprazole (PROTONIX) EC tablet 40 mg (has no administration in time range)   The patient is no longer having urinary symptoms, consistent with successful treatment.  His previously treated facial cyst has completely healed and the scar is barely visible.  The cause of his morning foul taste is most consistent with acid reflux and we will treat him with a PPI.   PROCEDURES  Procedures   ED DIAGNOSES     ICD-10-CM   1. Bad taste  in mouth  R43.8          Janeene Sand, MD 05/20/22 [phone]

## 2022-05-20 NOTE — ED Triage Notes (Signed)
Was seen for abscess on left ear and drained here. States had gotten a letter to come back to be reevaluated. Also has bad taste in mornings after taking Rx meds.

## 2023-01-01 ENCOUNTER — Other Ambulatory Visit: Payer: Self-pay

## 2023-01-01 ENCOUNTER — Emergency Department (HOSPITAL_BASED_OUTPATIENT_CLINIC_OR_DEPARTMENT_OTHER)
Admission: EM | Admit: 2023-01-01 | Discharge: 2023-01-01 | Disposition: A | Discharge status: Home / Self Care | Payer: Self-pay | Attending: Emergency Medicine | Admitting: Emergency Medicine

## 2023-01-01 ENCOUNTER — Encounter (HOSPITAL_BASED_OUTPATIENT_CLINIC_OR_DEPARTMENT_OTHER): Payer: Self-pay | Admitting: Emergency Medicine

## 2023-01-01 DIAGNOSIS — H6122 Impacted cerumen, left ear: Secondary | ICD-10-CM | POA: Insufficient documentation

## 2023-01-01 MED ORDER — DOCUSATE SODIUM 50 MG/5ML PO LIQD
10.0000 mg | Freq: Once | ORAL | Status: AC
Start: 2023-01-01 — End: 2023-01-01
  Administered 2023-01-01: 10 mg via OTIC
  Filled 2023-01-01: qty 10

## 2023-01-01 NOTE — ED Triage Notes (Signed)
Patient arrived via POV c/o left ear pin with possible ear wax impaction. Patient states muffled hearing on waking. Patient has tried OTC remedies with no success. Patient is AO x 4, VS WDL, normal gait.

## 2023-01-01 NOTE — ED Provider Notes (Signed)
   [place] [facility]  Provider Note  CSN: [id number] Arrival date & time: 01/01/23 0226  History Chief Complaint  Patient presents with   ear pain    Christopher Roman is a 42 y.o. male presents for muffled hearing in L ear ongoing for several weeks, tried using Q-tip, peroxide and OTC cerumen removal without improvement. No pain or drainage.    Home Medications Prior to Admission medications   Medication Sig Start Date End Date Taking? Authorizing Provider  esomeprazole (NEXIUM) 40 MG capsule Take 1 capsule (40 mg total) by mouth daily. 05/20/22   Molpus, Jonny Ruiz, MD     Allergies    Patient has no known allergies.   Review of Systems   Review of Systems Please see HPI for pertinent positives and negatives  Physical Exam BP 119/79 (BP Location: Right Arm)   Pulse 76   Temp 98.3 F (36.8 C) (Oral)   Resp 17   Ht 5\' 9"  (1.753 m)   Wt 62.6 kg   SpO2 100%   BMI 20.38 kg/m   Physical Exam Vitals and nursing note reviewed.  HENT:     Head: Normocephalic.     Right Ear: Tympanic membrane and ear canal normal.     Left Ear: There is impacted cerumen.     Nose: Nose normal.  Eyes:     Extraocular Movements: Extraocular movements intact.  Pulmonary:     Effort: Pulmonary effort is normal.  Musculoskeletal:        General: Normal range of motion.     Cervical back: Neck supple.  Skin:    Findings: No rash (on exposed skin).  Neurological:     Mental Status: He is alert and oriented to person, place, and time.  Psychiatric:        Mood and Affect: Mood normal.     ED Results / Procedures / Treatments   EKG None  Procedures Procedures  Medications Ordered in the ED Medications  docusate (COLACE) 50 MG/5ML liquid 10 mg (10 mg Left EAR Given 01/01/23 0306)    Initial Impression and Plan  Attempted cerumen removal with curette, but cerumen is deep and patient did not tolerate. Will attempt irrigation after colace softening.    ED Course   Clinical Course as of 01/01/23 0404  Tue Jan 01, 2023  0343 After initial irrigation, large amount of cerumen removed but some still remains. Will ask EMT to continue irrigating.  [CS]  0402 Patient still has some cerumen after repeat irrigation. Advised he will need to follow up with ENT for further debridement.  [CS]    Clinical Course User Index [CS] Pollyann Savoy, MD     MDM Rules/Calculators/A&P Medical Decision Making Problems Addressed: Impacted cerumen of left ear: acute illness or injury  Risk OTC drugs.     Final Clinical Impression(s) / ED Diagnoses Final diagnoses:  Impacted cerumen of left ear    Rx / DC Orders ED Discharge Orders     None        Pollyann Savoy, MD 01/01/23 [phone]

## 2023-01-07 ENCOUNTER — Encounter: Payer: Self-pay | Admitting: *Deleted
